# Patient Record
Sex: Male | Born: 1975 | Race: White | Hispanic: No | Marital: Single | Smoking: Current every day smoker
Health system: Southern US, Community
[De-identification: ages and names within clinical notes are randomized; demographics above are authoritative.]

## PROBLEM LIST (undated history)

## (undated) DIAGNOSIS — F1110 Opioid abuse, uncomplicated: Secondary | ICD-10-CM

## (undated) DIAGNOSIS — F1520 Other stimulant dependence, uncomplicated: Secondary | ICD-10-CM

## (undated) DIAGNOSIS — F32A Depression, unspecified: Secondary | ICD-10-CM

## (undated) DIAGNOSIS — F329 Major depressive disorder, single episode, unspecified: Secondary | ICD-10-CM

## (undated) DIAGNOSIS — F419 Anxiety disorder, unspecified: Secondary | ICD-10-CM

---

## 2005-02-13 ENCOUNTER — Emergency Department (HOSPITAL_COMMUNITY): Admission: EM | Admit: 2005-02-13 | Discharge: 2005-02-13 | Payer: Self-pay | Admitting: Emergency Medicine

## 2008-03-02 ENCOUNTER — Emergency Department (HOSPITAL_COMMUNITY): Admission: EM | Admit: 2008-03-02 | Discharge: 2008-03-02 | Payer: Self-pay | Admitting: Emergency Medicine

## 2008-03-07 ENCOUNTER — Emergency Department (HOSPITAL_COMMUNITY): Admission: EM | Admit: 2008-03-07 | Discharge: 2008-03-08 | Payer: Self-pay | Admitting: Emergency Medicine

## 2008-03-10 ENCOUNTER — Emergency Department (HOSPITAL_COMMUNITY): Admission: EM | Admit: 2008-03-10 | Discharge: 2008-03-11 | Payer: Self-pay | Admitting: Emergency Medicine

## 2008-03-11 ENCOUNTER — Ambulatory Visit: Payer: Self-pay | Admitting: *Deleted

## 2008-03-11 ENCOUNTER — Inpatient Hospital Stay (HOSPITAL_COMMUNITY): Admission: AD | Admit: 2008-03-11 | Discharge: 2008-03-13 | Payer: Self-pay | Admitting: Psychiatry

## 2009-02-27 ENCOUNTER — Emergency Department (HOSPITAL_COMMUNITY): Admission: EM | Admit: 2009-02-27 | Discharge: 2009-02-28 | Payer: Self-pay | Admitting: Emergency Medicine

## 2009-02-28 ENCOUNTER — Inpatient Hospital Stay (HOSPITAL_COMMUNITY): Admission: RE | Admit: 2009-02-28 | Discharge: 2009-03-01 | Payer: Self-pay | Admitting: Psychiatry

## 2009-03-02 ENCOUNTER — Ambulatory Visit: Payer: Self-pay | Admitting: Psychiatry

## 2009-12-11 IMAGING — CR DG CHEST 2V
2 series · 2 of 2 positions shown · non-contrast
Comparison: 02/13/2005

CLINICAL DATA: Chest pain

CHEST - 2 VIEW

[w chest pa]
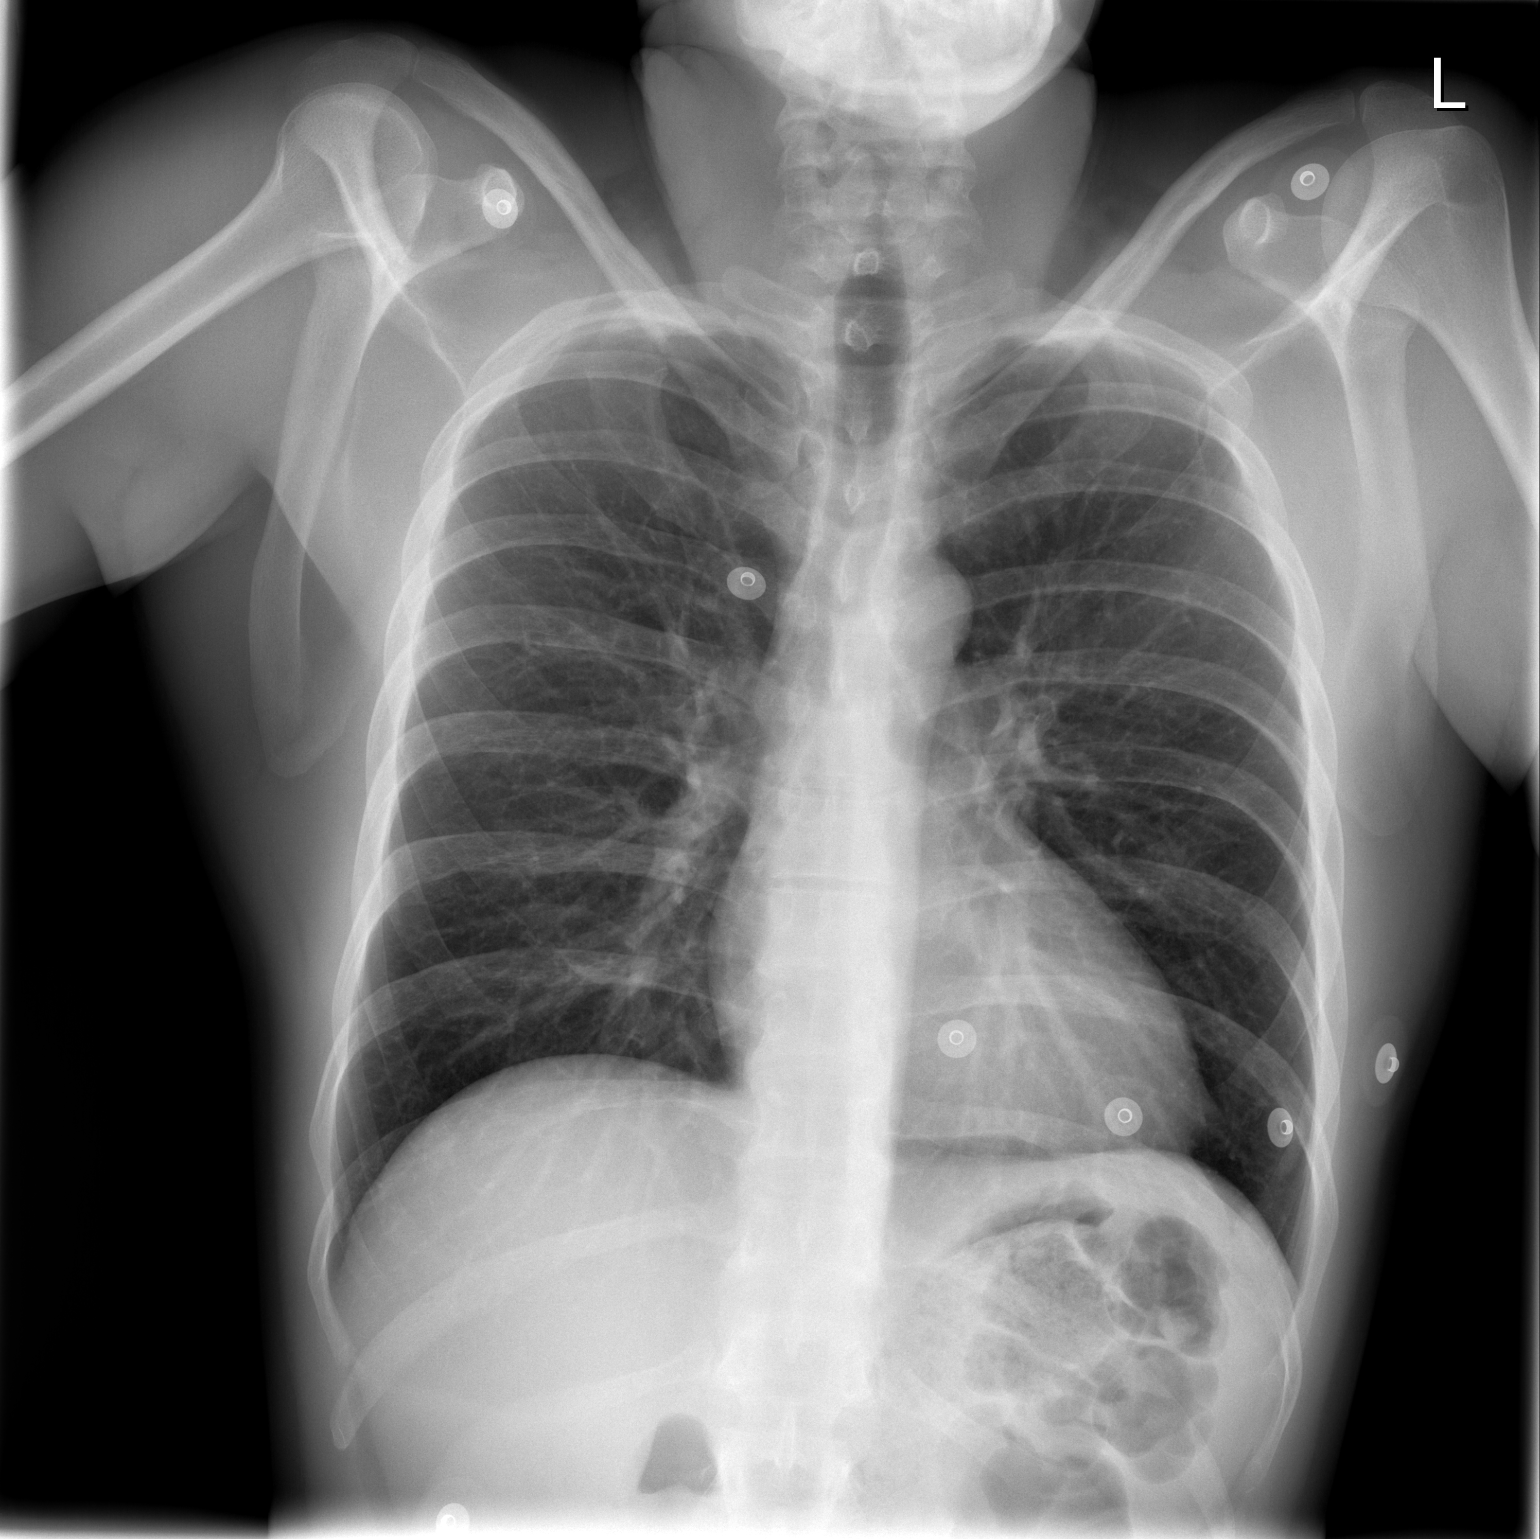

[w chest lat]
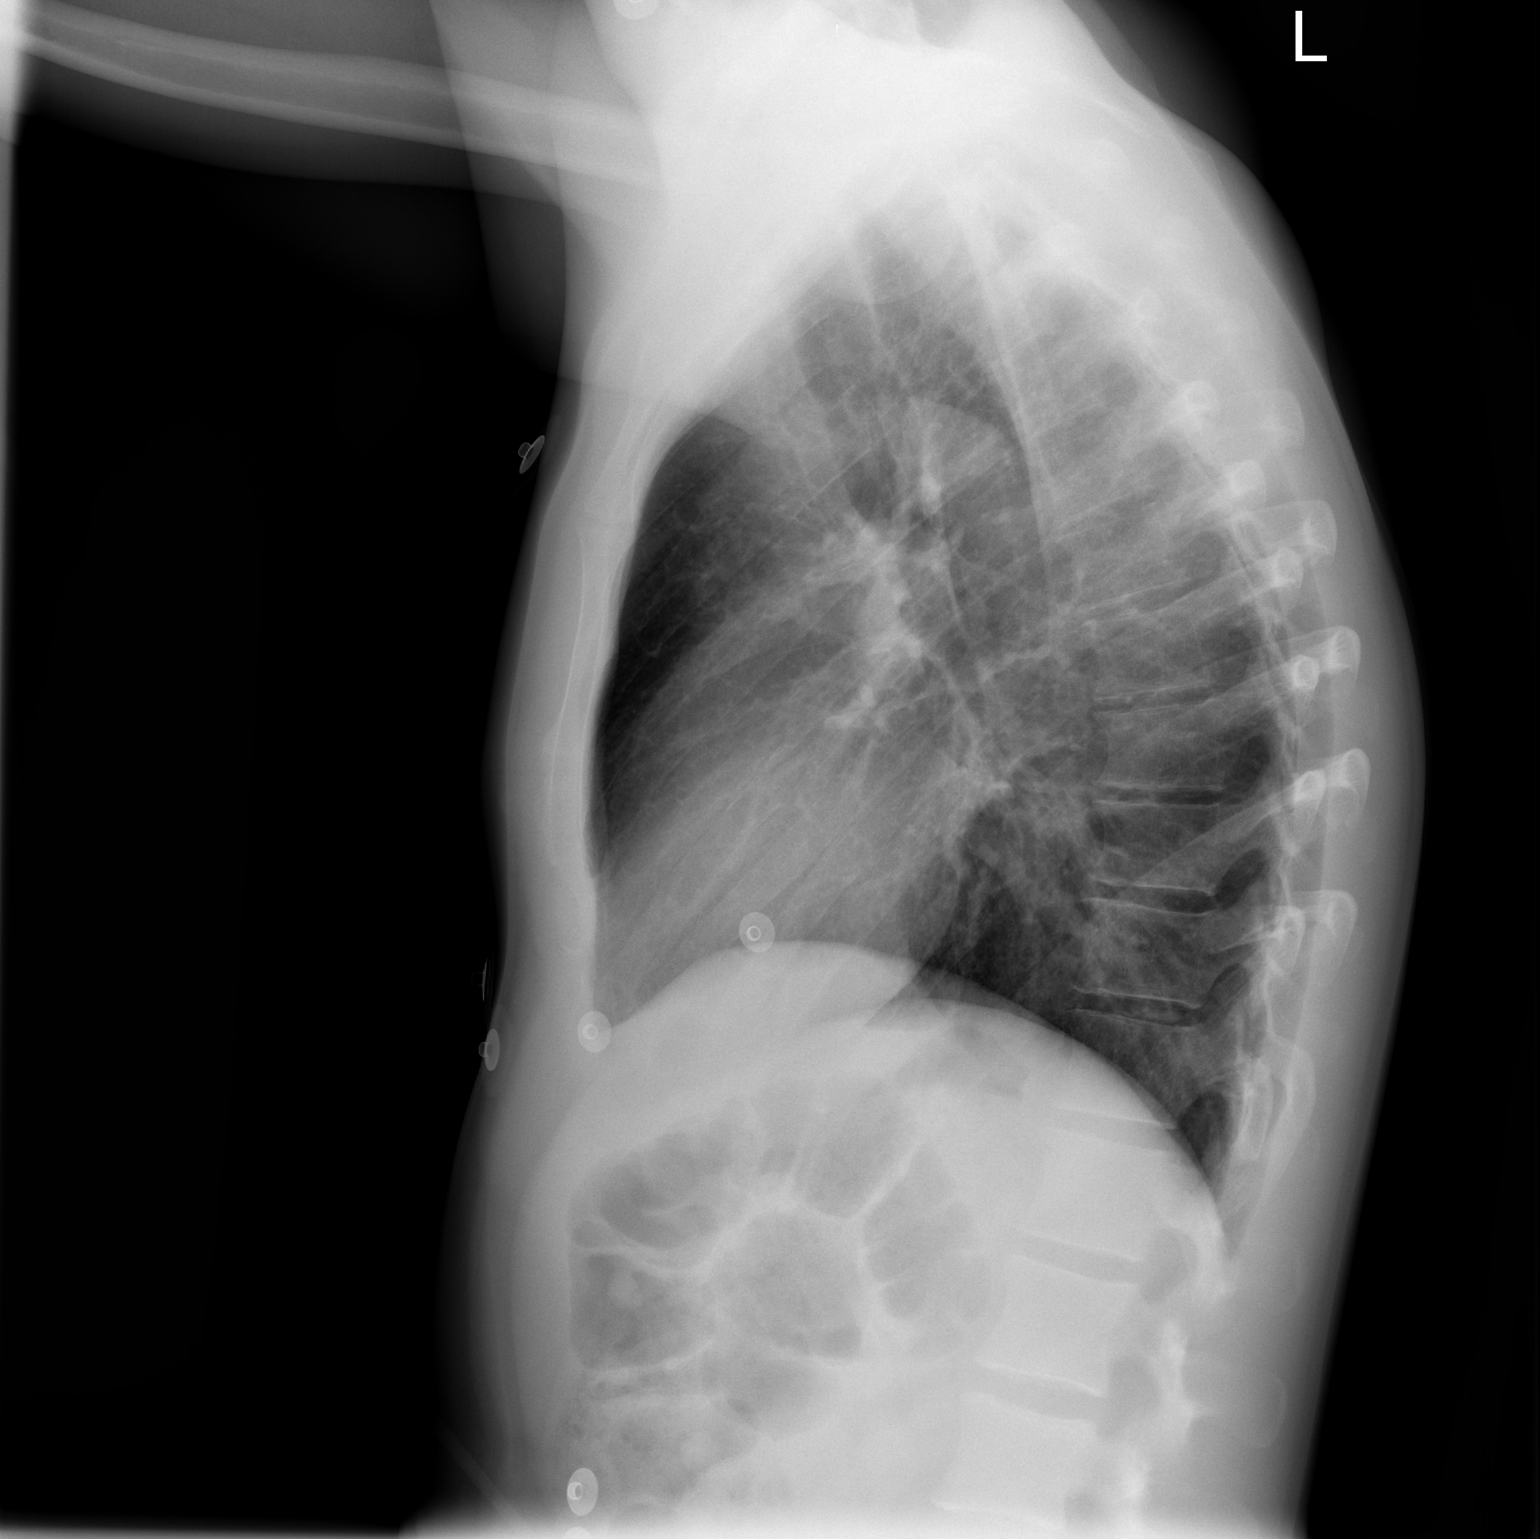

[2 of 2 positions shown; findings below may reference images not displayed]

FINDINGS: Normal heart size, mediastinal contours, and pulmonary vascularity.
Lungs clear.
Bones unremarkable.
No pneumothorax.
IMPRESSION: No acute abnormalities.

## 2010-09-11 LAB — COMPREHENSIVE METABOLIC PANEL
ALT: 14 U/L (ref 0–53)
Alkaline Phosphatase: 60 U/L (ref 39–117)
BUN: 7 mg/dL (ref 6–23)
CO2: 26 mEq/L (ref 19–32)
Calcium: 9 mg/dL (ref 8.4–10.5)
Chloride: 104 mEq/L (ref 96–112)
GFR calc Af Amer: >60 mL/min (ref >60)
GFR calc non Af Amer: >60 mL/min (ref >60)
Glucose, Bld: 108 mg/dL — ABNORMAL HIGH (ref 70–99)
Total Bilirubin: 0.5 mg/dL (ref 0.3–1.2)

## 2010-09-11 LAB — URINALYSIS, ROUTINE W REFLEX MICROSCOPIC
Bilirubin Urine: NEGATIVE
Urobilinogen, UA: 0.2 mg/dL (ref 0.0–1.0)
pH: 5.5 (ref 5.0–8.0)

## 2010-09-11 LAB — CBC
HCT: 45.4 % (ref 39.0–52.0)
Hemoglobin: 15.7 g/dL (ref 13.0–17.0)
MCHC: 34.6 g/dL (ref 30.0–36.0)
Platelets: 284 10*3/uL (ref 150–400)
RBC: 4.82 MIL/uL (ref 4.22–5.81)

## 2010-09-11 LAB — DIFFERENTIAL
Basophils Relative: 1 % (ref 0–1)
Eosinophils Relative: 2 % (ref 0–5)
Lymphs Abs: 3 10*3/uL (ref 0.7–4.0)
Monocytes Relative: 6 % (ref 3–12)

## 2010-09-11 LAB — RAPID URINE DRUG SCREEN, HOSP PERFORMED
Barbiturates: NOT DETECTED
Cocaine: NOT DETECTED
Opiates: NOT DETECTED
Tetrahydrocannabinol: NOT DETECTED

## 2010-09-11 LAB — ETHANOL: Alcohol, Ethyl (B): 275 mg/dL — ABNORMAL HIGH (ref 0–10)

## 2010-10-20 NOTE — H&P (Signed)
NAME:  Adam Riley, Adam Riley                   ACCOUNT NO.:  [id number]   MEDICAL RECORD NO.:  [id number]          PATIENT TYPE:  IPS   LOCATION:  0301                          FACILITY:  [facility]   PHYSICIAN:  Jasmine Pang, M.D. DATE OF BIRTH:  [birth year]   DATE OF ADMISSION:  03/11/2008  DATE OF DISCHARGE:                       PSYCHIATRIC ADMISSION ASSESSMENT   This is a 35 year old male voluntarily admitted on March 11, 2008.   HISTORY OF PRESENT ILLNESS:  The patient presents feeling depressed,  having anxiety attacks.  Has been seeing things and hearing things.  He states they are not everyday.  He states that when he does have  having any psychotic symptoms it makes him anxious.  He has been  drinking beer almost on a daily basis to help with the voices.  He has  lost about 25-30 pounds.  He wants to look for some long-term rehab.  He  does report some suicidal thoughts but has no specific plan.  He denies  any drug use and has had no prior mental health treatment at this time.   PAST PSYCHIATRIC HISTORY:  First admission to the [facility].  He was, at the age of 51, was in [facility] where he  states he has a history of cutting.  Denies any current self-inflicted  injuries and is currently sponsored by [facility].   SOCIAL HISTORY:  He is a 35 year old divorced male who lives in  [place].  He was living with a roommate.  The patient has three  children that he states he is not able to see.  He has a ninth grade  education and is unemployed.   FAMILY HISTORY:  None.   ALCOHOL AND DRUG HISTORY:  The patient smokes.  Denies any drug use.  Again, has been drinking up to a case of beer at a time.  Alcohol level  in the emergency room was 346.   PRIMARY CARE Markes Shatswell:  None.   MEDICAL PROBLEMS:  He reports a history of a heart murmur.  No other  cardiac problems.  Denies any other chronic health issues.   MEDICATIONS:  None prior to arrival.   DRUG  ALLERGIES:  No known allergies.   PHYSICAL EXAMINATION:  GENERAL:  This is a somewhat slender male.  He is  casually dressed.  He appears in no acute distress.  He does seem to be  internally preoccupied.  He was fully assessed at [facility] emergency  department where it states that the patient was paranoid, was defensive  and accusatory, talking about government involvement and being under  cover.  VITAL SIGNS:  Temperature of 97.1, 102 heart rate, 18 respirations,  blood pressure is 158/87, 5 feet 5 inches tall, 118 pounds.   LABORATORY DATA:  CMET within normal limits.  CBC within normal limits.  Urinalysis is negative.  Urine drug screen is negative.  Alcohol level  was 346, down to 241 prior to transfer.  Salicylate less than 4.  X-ray  was done of his left hand (he was reporting complaints of pain) which  was  negative.   MENTAL STATUS EXAM:  The patient is fully alert.  He is cooperative with  minimal eye contact.  Appears somewhat unkempt.  He seems to be slender  and does report that he has lost about 20-30 pounds over an unspecified  period of time.  Speech is soft-spoken.  Mood is depressed.  He seems to  be possibly responding to internal stimuli, but there are no obvious  delusional statements at this time.  Denies any current suicidal  thoughts.  He is wanting help in regards to his alcohol use.  He seems  to be aware of self and situation.  Judgment is fair.   IMPRESSION:  AXIS I:  Depressive disorder NOS.  Alcohol abuse, rule out  dependence, rule out substance-induced psychosis.  AXIS II:  Deferred.  AXIS III:  Heart murmur per patient.  AXIS IV:  Possible problems with housing, occupation, psychosocial  problems, lack of support.  AXIS V:  Current is 30-35.   PLAN:  Plans to contract for safety.  Stabilize mood and will initiate  the Librium protocol.  Will monitor withdrawal symptoms.  Work on  relapse prevention.  We will have Zyprexa available at bedtime for   psychotic symptoms and have p.r.n. Zyprexa throughout the day for  psychosis and agitation.  We will identify support.  Case manager will  look at any long-term rehabilitation, reinforce medication compliance  and continue to assess comorbidities.   TENTATIVE LENGTH OF STAY:  The tentative length of stay at this time is  4-5 days.      Landry Corporal, N.P.      Jasmine Pang, M.D.  Electronically Signed    JO/MEDQ  D:  03/12/2008  T:  03/12/2008  Job:  [id number]

## 2010-10-23 NOTE — Discharge Summary (Signed)
NAME:  Adam Riley, Adam Riley                   ACCOUNT NO.:  [id number]   MEDICAL RECORD NO.:  [id number]          PATIENT TYPE:  IPS   LOCATION:  0301                          FACILITY:  [facility]   PHYSICIAN:  Jasmine Pang, M.D. DATE OF BIRTH:  Dec 21, 1975   DATE OF ADMISSION:  03/11/2008  DATE OF DISCHARGE:  03/13/2008                               DISCHARGE SUMMARY   IDENTIFICATION:  This is a 35 year old male who was admitted on a  voluntary basis on March 11, 2008.   HISTORY OF PRESENT ILLNESS:  The patient presents feeling depressed.  He  is also having anxiety attacks.  He states he has been seeing things  and hearing things.  He states they are not every day.  He states that  when he does have any psychotic symptoms, makes him anxious.  He has  been drinking beer almost on a daily basis to help with the voices and  he has lost about 25-30 pounds.  He wants to look for some long-term  rehab.  He does report some suicidal thoughts, but has no specific plan.  He denies any drug use and has had no prior mental health treatment in  this hospital.   PAST PSYCHIATRIC HISTORY:  This is the first admission to [facility].  He was  at the age of 68 in [facility] and he states he has history of  cutting.  He denies any current self-inflicted injuries.  He is  currently sponsored by [facility].   FAMILY HISTORY:  None.   ALCOHOL AND DRUG HISTORY:  The patient smokes.  He denies any drug use.  Again, he has been drinking up to a case of beer at a time.  Alcohol  level in the emergency room was 346.   PRIMARY CARE Raevon Broom:  None.   MEDICAL PROBLEMS:  He reports a history of a heart murmur.  No other  cardiac problems.  He denies any other chronic health issues.   MEDICATIONS:  None.   DRUG ALLERGIES:  No known drug allergies.   PHYSICAL FINDINGS:  There were no acute physical or medical problems  noted.  He was fully assessed at the [facility] Emergency Department and  stated the  patient was paranoid, defensive, and accusatory and talking  about governmental involvement and being undercover.   LABORATORY DATA:  CMET was within normal limits.  CBC was within normal  limits.  Urinalysis is negative.  Urine drug screen is negative.  Alcohol level was 346, down to 241 prior to transfer.  Salicylate level  less than 4.  X-ray was done of the left hand as he was reporting pain.  This was negative.   HOSPITAL COURSE:  Upon admission, the patient was started on the Librium  detox protocol and Ambien 10 mg p.o. q.h.s. p.r.n.  He was also started  on Zyprexa Zydis 5 mg q.6 hours p.r.n. agitation, psychosis and Zyprexa  Zydis 5 mg p.o. q.h.s.  In individual sessions, the patient was friendly  and cooperative.  He states he was having anxiety attacks and seeing  things and hearing things.  He states he went to the ED after calling  911.  He was depressed.  He talked about housing.  Depression and stress  being his main focus of treatment.  He did not have a place to live.  He  does not think his grandmother or father would taking him again.  He was  offered possibly substance abuse treatment or [employer].  He was more  interested in the [facility] and was given a list of these.  On  March 13, 2008, the patient admitted.  He had lied about suicidal  ideation or to get into the hospital because he had no place to go.  His  sleep was good.  Appetite was good.  Mood euthymic.  Affect consistent  with mood.  There was no suicidal or homicidal ideation.  No thoughts of  self-injurious behavior.  No auditory or visual hallucinations.  No  paranoia or delusions.  Thoughts were logical and goal-directed, thought  content.  No predominant theme.  Cognitive was grossly intact.  Insight  good, judgment good, impulse control good.  The patient was felt to be  safe for discharge.   DISCHARGE DIAGNOSES:  Axis I:  Mood disorder, not otherwise specified.  Alcohol dependence.  Axis  II:  None.  Axis III:  Heart murmur per the patient.  Axis IV:  Severe (possible problems with housing, occupation and  psychosocial problems, lack of support, and burden of psychiatric and  chemical dependence illness).  Axis V:  Global assessment of functioning was 50 upon discharge.  GAF  was 35 upon admission.  GAF was 60-65 highest past year.   DISCHARGE PLANS:  There was no specific activity level or dietary  restrictions.   POSTHOSPITAL CARE PLANS:  The patient will go to the [facility] in  [place] on March 15, 2008, to 9 o'clock a.m.   DISCHARGE MEDICATIONS:  1. Zyprexa 5 mg at bedtime.  He was given a prescription for these as      well as some samples.  2. Librium 25 mg 1 pill today, 1 pill twice daily tomorrow, then 1      pill daily on March 15, 2008, then discontinue and detox will be      completed.      Jasmine Pang, M.D.  Electronically Signed     BHS/MEDQ  D:  04/13/2008  T:  04/14/2008  Job:  [id number]

## 2010-11-03 ENCOUNTER — Emergency Department (HOSPITAL_COMMUNITY)
Admission: EM | Admit: 2010-11-03 | Discharge: 2010-11-04 | Disposition: A | Discharge status: Other Acute Inpatient Hospital | Payer: Self-pay | Attending: Emergency Medicine | Admitting: Emergency Medicine

## 2010-11-03 DIAGNOSIS — R45851 Suicidal ideations: Secondary | ICD-10-CM | POA: Insufficient documentation

## 2010-11-03 DIAGNOSIS — F1010 Alcohol abuse, uncomplicated: Secondary | ICD-10-CM | POA: Insufficient documentation

## 2010-11-03 DIAGNOSIS — F411 Generalized anxiety disorder: Secondary | ICD-10-CM | POA: Insufficient documentation

## 2010-11-03 DIAGNOSIS — F4310 Post-traumatic stress disorder, unspecified: Secondary | ICD-10-CM | POA: Insufficient documentation

## 2010-11-03 DIAGNOSIS — IMO0002 Reserved for concepts with insufficient information to code with codable children: Secondary | ICD-10-CM | POA: Insufficient documentation

## 2010-11-03 DIAGNOSIS — F29 Unspecified psychosis not due to a substance or known physiological condition: Secondary | ICD-10-CM | POA: Insufficient documentation

## 2010-11-03 LAB — COMPREHENSIVE METABOLIC PANEL
ALT: 143 U/L — ABNORMAL HIGH (ref 0–53)
AST: 61 U/L — ABNORMAL HIGH (ref 0–37)
Albumin: 4.2 g/dL (ref 3.5–5.2)
Alkaline Phosphatase: 67 U/L (ref 39–117)
BUN: 9 mg/dL (ref 6–23)
CO2: 22 mEq/L (ref 19–32)
Calcium: 8.8 mg/dL (ref 8.4–10.5)
Chloride: 96 mEq/L (ref 96–112)
GFR calc Af Amer: >60 mL/min (ref >60)
GFR calc non Af Amer: >60 mL/min (ref >60)
Glucose, Bld: 82 mg/dL (ref 70–99)
Potassium: 3.7 mEq/L (ref 3.5–5.1)
Total Protein: 8.3 g/dL (ref 6.0–8.3)

## 2010-11-03 LAB — ETHANOL: Alcohol, Ethyl (B): 258 mg/dL — ABNORMAL HIGH (ref 0–10)

## 2010-11-03 LAB — RAPID URINE DRUG SCREEN, HOSP PERFORMED
Amphetamines: NOT DETECTED
Benzodiazepines: NOT DETECTED
Cocaine: NOT DETECTED

## 2010-11-03 LAB — CBC
HCT: 44.1 % (ref 39.0–52.0)
MCHC: 35.6 g/dL (ref 30.0–36.0)
Platelets: 312 10*3/uL (ref 150–400)
RDW: 12.6 % (ref 11.5–15.5)
WBC: 8.9 10*3/uL (ref 4.0–10.5)

## 2010-11-03 LAB — DIFFERENTIAL
Basophils Absolute: 0 10*3/uL (ref 0.0–0.1)
Lymphocytes Relative: 29 % (ref 12–46)

## 2010-11-04 ENCOUNTER — Inpatient Hospital Stay (HOSPITAL_COMMUNITY)
Admission: AD | Admit: 2010-11-04 | Discharge: 2010-11-06 | DRG: 885 | Disposition: A | Discharge status: Home / Self Care | Payer: PRIVATE HEALTH INSURANCE | Source: Ambulatory Visit | Attending: Psychiatry | Admitting: Psychiatry

## 2010-11-04 DIAGNOSIS — F3289 Other specified depressive episodes: Secondary | ICD-10-CM

## 2010-11-04 DIAGNOSIS — Z5900 Homelessness unspecified: Secondary | ICD-10-CM

## 2010-11-04 DIAGNOSIS — F4320 Adjustment disorder, unspecified: Secondary | ICD-10-CM

## 2010-11-04 DIAGNOSIS — Z560 Unemployment, unspecified: Secondary | ICD-10-CM

## 2010-11-04 DIAGNOSIS — F29 Unspecified psychosis not due to a substance or known physiological condition: Principal | ICD-10-CM

## 2010-11-04 DIAGNOSIS — R45851 Suicidal ideations: Secondary | ICD-10-CM

## 2010-11-04 DIAGNOSIS — Z6379 Other stressful life events affecting family and household: Secondary | ICD-10-CM

## 2010-11-04 DIAGNOSIS — F1010 Alcohol abuse, uncomplicated: Secondary | ICD-10-CM

## 2010-11-04 DIAGNOSIS — F411 Generalized anxiety disorder: Secondary | ICD-10-CM

## 2010-11-04 DIAGNOSIS — F329 Major depressive disorder, single episode, unspecified: Secondary | ICD-10-CM

## 2010-11-05 DIAGNOSIS — F1020 Alcohol dependence, uncomplicated: Secondary | ICD-10-CM

## 2010-11-06 DIAGNOSIS — F1020 Alcohol dependence, uncomplicated: Secondary | ICD-10-CM

## 2010-11-06 DIAGNOSIS — F39 Unspecified mood [affective] disorder: Secondary | ICD-10-CM
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
## 2011-03-08 LAB — URINALYSIS, ROUTINE W REFLEX MICROSCOPIC
Glucose, UA: NEGATIVE
Nitrite: NEGATIVE
Protein, ur: NEGATIVE
Urobilinogen, UA: 0.2

## 2011-03-08 LAB — RAPID URINE DRUG SCREEN, HOSP PERFORMED
Barbiturates: NOT DETECTED
Benzodiazepines: NOT DETECTED
Cocaine: NOT DETECTED
Opiates: NOT DETECTED
Tetrahydrocannabinol: NOT DETECTED

## 2011-03-08 LAB — DIFFERENTIAL
Basophils Absolute: 0.1
Eosinophils Absolute: 0.2
Eosinophils Relative: 1
Eosinophils Relative: 2
Lymphocytes Relative: 34
Lymphocytes Relative: 42
Lymphs Abs: 3.2
Monocytes Relative: 7

## 2011-03-08 LAB — COMPREHENSIVE METABOLIC PANEL
ALT: 19
ALT: 19
AST: 28
BUN: 7
Calcium: 9
Creatinine, Ser: 0.75
Creatinine, Ser: 0.63
GFR calc Af Amer: >60
Glucose, Bld: 93
Potassium: 3.8
Sodium: 142
Total Bilirubin: 0.7
Total Protein: 7.2

## 2011-03-08 LAB — ETHANOL
Alcohol, Ethyl (B): 332 — ABNORMAL HIGH
Alcohol, Ethyl (B): 93 — ABNORMAL HIGH
Alcohol, Ethyl (B): 241 — ABNORMAL HIGH

## 2011-03-08 LAB — POCT CARDIAC MARKERS: CKMB, poc: 2.7

## 2011-03-08 LAB — CBC
HCT: 46
MCHC: 34.5
MCHC: 34.5
Platelets: 273
Platelets: 280
RBC: 4.46
RDW: 12.6
WBC: 7.4

## 2011-03-08 LAB — ACETAMINOPHEN LEVEL: Acetaminophen (Tylenol), Serum: <10 — ABNORMAL LOW

## 2011-03-09 LAB — DIFFERENTIAL
Basophils Absolute: 0
Basophils Relative: 0
Eosinophils Absolute: 0
Eosinophils Relative: 1
Lymphocytes Relative: 38
Lymphs Abs: 2.9
Monocytes Absolute: 0.4
Monocytes Relative: 5
Neutro Abs: 4.3
Neutrophils Relative %: 56

## 2011-03-09 LAB — RAPID URINE DRUG SCREEN, HOSP PERFORMED
Amphetamines: NOT DETECTED
Barbiturates: NOT DETECTED
Benzodiazepines: NOT DETECTED
Cocaine: NOT DETECTED
Opiates: NOT DETECTED
Tetrahydrocannabinol: NOT DETECTED

## 2011-03-09 LAB — CBC
HCT: 46.3
Hemoglobin: 15.7
MCHC: 34
MCV: 97.3
Platelets: 288
RBC: 4.76
RDW: 12.5
WBC: 7.7

## 2011-03-09 LAB — COMPREHENSIVE METABOLIC PANEL
ALT: 20
AST: 25
Albumin: 4.3
Alkaline Phosphatase: 53
BUN: 6
CO2: 26
Calcium: 9.1
Chloride: 103
Creatinine, Ser: 0.68
GFR calc Af Amer: >60
GFR calc non Af Amer: >60
Glucose, Bld: 105 — ABNORMAL HIGH
Potassium: 4.2
Sodium: 140
Total Bilirubin: 0.8
Total Protein: 7.5

## 2011-03-09 LAB — ETHANOL: Alcohol, Ethyl (B): 296 — ABNORMAL HIGH

## 2011-04-11 ENCOUNTER — Encounter: Payer: Self-pay | Admitting: *Deleted

## 2011-04-11 ENCOUNTER — Emergency Department (HOSPITAL_COMMUNITY)
Admission: EM | Admit: 2011-04-11 | Discharge: 2011-04-12 | Disposition: A | Discharge status: Home / Self Care | Payer: Self-pay | Attending: Emergency Medicine | Admitting: Emergency Medicine

## 2011-04-11 DIAGNOSIS — IMO0002 Reserved for concepts with insufficient information to code with codable children: Secondary | ICD-10-CM | POA: Insufficient documentation

## 2011-04-11 DIAGNOSIS — F329 Major depressive disorder, single episode, unspecified: Secondary | ICD-10-CM | POA: Insufficient documentation

## 2011-04-11 DIAGNOSIS — F1010 Alcohol abuse, uncomplicated: Secondary | ICD-10-CM | POA: Insufficient documentation

## 2011-04-11 DIAGNOSIS — R45851 Suicidal ideations: Secondary | ICD-10-CM | POA: Insufficient documentation

## 2011-04-11 DIAGNOSIS — F3289 Other specified depressive episodes: Secondary | ICD-10-CM | POA: Insufficient documentation

## 2011-04-11 HISTORY — DX: Anxiety disorder, unspecified: F41.9
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
## 2011-04-11 NOTE — ED Provider Notes (Signed)
History     CSN: [id number] Arrival date & time: 04/11/2011 11:22 PM   First MD Initiated Contact with Patient 04/11/11 2338      Chief Complaint  Patient presents with  . Psychiatric Evaluation    (Consider location/radiation/quality/duration/timing/severity/associated sxs/prior treatment) HPI Comments: Pt with hx of alcohol intoxication and "PTSD" according to pt - has been seen at [facility] several times in the past for ETOH detox and found to not have [employer] service which he says he has when he sobers up - pt states only "I want to die" and states he has nothing to live for.  He admits to attempting suicide tonight but does not say how.  He has no physical c/o but refuses to answer most questions.  Hx limited by alcohol intoxication and uncooperativeness  The history is provided by the patient and medical records. The history is limited by the condition of the patient.    Past Medical History  Diagnosis Date  . Anxiety     History reviewed. No pertinent past surgical history.  History reviewed. No pertinent family history.  History  Substance Use Topics  . Smoking status: Current Everyday Smoker  . Smokeless tobacco: Not on file  . Alcohol Use: Yes      Review of Systems  Unable to perform ROS: Other    Allergies  Review of patient's allergies indicates no known allergies.  Home Medications  No current outpatient prescriptions on file.  BP 121/61  Pulse 90  Temp(Src) 97.7 F (36.5 C) (Oral)  Resp 20  SpO2 96%  Physical Exam  Nursing note and vitals reviewed. Constitutional: He appears well-developed and well-nourished.       agitated  HENT:  Head: Normocephalic and atraumatic.  Mouth/Throat: Oropharynx is clear and moist. No oropharyngeal exudate.  Eyes: Conjunctivae and EOM are normal. Pupils are equal, round, and reactive to light. Right eye exhibits no discharge. Left eye exhibits no discharge. No scleral icterus.  Neck: Normal range of motion. Neck  supple. No JVD present. No thyromegaly present.  Cardiovascular: Normal rate, regular rhythm, normal heart sounds and intact distal pulses.  Exam reveals no gallop and no friction rub.   No murmur heard. Pulmonary/Chest: Effort normal and breath sounds normal. No respiratory distress. He has no wheezes. He has no rales.  Abdominal: Soft. Bowel sounds are normal. He exhibits no distension and no mass. There is no tenderness.  Musculoskeletal: Normal range of motion. He exhibits no edema and no tenderness.  Lymphadenopathy:    He has no cervical adenopathy.  Neurological: He is alert.       Slight dyscoordination withi ambulation, slurred speech slightly, follows commands, normal strnegth, no facial droop  Skin: Skin is warm and dry. No rash noted. No erythema.  Psychiatric:       Mood depressed, admits to SI and attempt    ED Course  Procedures (including critical care time)  Labs Reviewed  COMPREHENSIVE METABOLIC PANEL - Abnormal; Notable for the following:    AST 78 (*)    ALT 88 (*)    Total Bilirubin 0.1 (*)    All other components within normal limits  ETHANOL - Abnormal; Notable for the following:    Alcohol, Ethyl (B) 237 (*)    All other components within normal limits  CBC  URINE RAPID DRUG SCREEN (HOSP PERFORMED)   No results found.   1. Suicidal ideation   2. Alcohol abuse       MDM  Will need  w/u for SI and ETOH abuse - has normal VS at this time, no signs of self injury.  Has hx of ETOH abuse with delusions of miilitary service.   Labs reviewed, slight elevation of LFT"s, likely ETOH related, VS stable, psychiatry ACT team aware, change of shift - oncoming EDP to follow.     Vida Roller, MD 04/12/11 [phone]

## 2011-04-12 DIAGNOSIS — Z139 Encounter for screening, unspecified: Secondary | ICD-10-CM | POA: Insufficient documentation

## 2011-04-12 DIAGNOSIS — F411 Generalized anxiety disorder: Secondary | ICD-10-CM | POA: Insufficient documentation

## 2011-04-12 DIAGNOSIS — F1010 Alcohol abuse, uncomplicated: Secondary | ICD-10-CM | POA: Insufficient documentation

## 2011-04-12 DIAGNOSIS — F17200 Nicotine dependence, unspecified, uncomplicated: Secondary | ICD-10-CM | POA: Insufficient documentation

## 2011-04-12 LAB — COMPREHENSIVE METABOLIC PANEL
AST: 78 U/L — ABNORMAL HIGH (ref 0–37)
BUN: 10 mg/dL (ref 6–23)
CO2: 25 mEq/L (ref 19–32)
Calcium: 9.2 mg/dL (ref 8.4–10.5)
Chloride: 104 mEq/L (ref 96–112)
Creatinine, Ser: 0.65 mg/dL (ref 0.50–1.35)
GFR calc Af Amer: >90 mL/min (ref >90)
GFR calc non Af Amer: >90 mL/min (ref >90)
Glucose, Bld: 93 mg/dL (ref 70–99)
Total Bilirubin: 0.1 mg/dL — ABNORMAL LOW (ref 0.3–1.2)

## 2011-04-12 LAB — CBC
HCT: 43.3 % (ref 39.0–52.0)
Hemoglobin: 14.9 g/dL (ref 13.0–17.0)
MCH: 32.8 pg (ref 26.0–34.0)
MCV: 95.4 fL (ref 78.0–100.0)
Platelets: 315 10*3/uL (ref 150–400)
RBC: 4.54 MIL/uL (ref 4.22–5.81)
WBC: 8 10*3/uL (ref 4.0–10.5)

## 2011-04-12 LAB — RAPID URINE DRUG SCREEN, HOSP PERFORMED
Barbiturates: NOT DETECTED
Cocaine: NOT DETECTED
Opiates: NOT DETECTED
Tetrahydrocannabinol: NOT DETECTED

## 2011-04-12 NOTE — ED Notes (Signed)
Sitter at bedside,  Pt in NAD

## 2011-04-12 NOTE — Progress Notes (Addendum)
Assessment Note   Adam Riley is an 35 y.o. male that presented to the ED inebriated and voicing suicidal ideation.  Pt has an extensive alcohol history and has two previous admissions for detox in 2009 and 2011.  Pt reports ongoing drinking, but is not currently interested in detox or substance abuse treatment due to job prospects.  Pt is able to contract for safety at this time and denies SI, or HI, or any active psychosis or withdrawal symptoms.  Information relayed to EDP and nursing staff who are agreeable with discharging patient with follow up referrals.   Axis I: Alcohol Abuse Axis II: Deferred Axis III: None Past Medical History  Diagnosis Date  . Anxiety    Axis IV: other psychosocial or environmental problems, problems related to social environment and problems with primary support group Axis V: 41-50 serious symptoms  Past Medical History:  Past Medical History  Diagnosis Date  . Anxiety     History reviewed. No pertinent past surgical history.  Family History: History reviewed. No pertinent family history.  Social History:  reports that he has been smoking.  He does not have any smokeless tobacco history on file. He reports that he drinks alcohol. He reports that he does not use illicit drugs.  Allergies: No Known Allergies  Home Medications:  No current outpatient prescriptions on file as of 04/11/2011.   No current facility-administered medications on file as of 04/11/2011.    OB/GYN Status:  No LMP for male patient.  General Assessment Data Living Arrangements: Homeless Can pt return to current living arrangement?: Yes Admission Status: Voluntary Is patient capable of signing voluntary admission?: Yes Transfer from: Acute Hospital Referral Source: MD  Risk to self Suicidal Ideation: No-Not Currently/Within Last 6 Months Suicidal Intent: No-Not Currently/Within Last 6 Months Is patient at risk for suicide?: Yes Suicidal Plan?: No-Not Currently/Within Last  6 Months Access to Means: Yes Specify Access to Suicidal Means: knives and pills available What has been your use of drugs/alcohol within the last 12 months?: Pt drinks up to 9 12 ozs QD for years Other Self Harm Risks: none Triggers for Past Attempts: Other personal contacts Intentional Self Injurious Behavior: None Factors that decrease suicide risk: Absense of psychosis Family Suicide History: No Recent stressful life event(s): Job Loss;Financial Problems Persecutory voices/beliefs?: No Depression: Yes Depression Symptoms: Feeling worthless/self pity;Feeling angry/irritable Substance abuse history and/or treatment for substance abuse?: Yes Suicide prevention information given to non-admitted patients: Yes  Risk to Others Homicidal Ideation: No Thoughts of Harm to Others: No Current Homicidal Intent: No Current Homicidal Plan: No Access to Homicidal Means: No Identified Victim: n/a History of harm to others?: No Assessment of Violence: None Noted Does patient have access to weapons?: No Criminal Charges Pending?: No Does patient have a court date: No  Mental Status Report Appear/Hygiene: Disheveled Eye Contact: Good Motor Activity: Unremarkable Speech: Logical/coherent Level of Consciousness: Alert Mood: Depressed;Ambivalent Affect: Preoccupied;Anxious Anxiety Level: Moderate Thought Processes: Relevant Judgement: Unimpaired Orientation: Person;Place;Time;Situation Obsessive Compulsive Thoughts/Behaviors: Moderate  Cognitive Functioning Concentration: Decreased Memory: Recent Impaired;Remote Intact IQ: Average Insight: Fair Impulse Control: Poor Appetite: Fair Sleep: No Change Vegetative Symptoms: None  Prior Inpatient/Outpatient Therapy Prior Therapy: Inpatient Prior Therapy Dates: 2009. 2011 Prior Therapy Facilty/Provider(s): [facility] Reason for Treatment: SA/Detox            Values / Beliefs Cultural Requests During Hospitalization: None Spiritual  Requests During Hospitalization: None        Additional Information 1:1 In Past 12 Months?:  No CIRT Risk: No Elopement Risk: No Does patient have medical clearance?: Yes     Disposition:  Disposition Disposition of Patient: Referred to [facility] and [facility]) Patient referred to: [facility]- patient refused inpatient treatment at this time and is able to contract for safety with referrals offered.  On Site Evaluation by:   Reviewed with Physician:     Angelica Ran 04/12/2011 4:31 PM

## 2011-04-12 NOTE — ED Notes (Signed)
Patient is resting comfortably. 

## 2011-04-12 NOTE — ED Notes (Signed)
Report given to Clovis Cao at [facility] ED.

## 2011-04-12 NOTE — ED Provider Notes (Signed)
Pt no longer suicidal and will follow up for out pt tx  Adam Lennert, MD 04/12/11 1135

## 2011-04-12 NOTE — ED Notes (Signed)
Pt woke up. This RN introduced to patient pt sts he is comfortable. Denied any needs. sts he is not Suicidal, sts he was SI last night because he was drunk.

## 2011-04-12 NOTE — ED Notes (Signed)
Report received from night RN. First contact with patient. Pt is asleep in room. Airway even and unlabor, nad noted, will continue to monitor. Sitter not available at this moment. Will transport and give report to get patient over th psy ed.

## 2011-04-12 NOTE — ED Notes (Signed)
Meal Tray Delivered.  

## 2011-04-12 NOTE — ED Notes (Signed)
Pt wanded by security in his clothing, pt did not want to remove his clothes. Dr. Hyacinth Meeker ok'd this if he was checked for cell phone and add'l objects. Merton Border, NT at bedside after security left, pt may be agreeable to putting on blue scrubs at this time. Explained to pt that he could not leave due to saying he wanted to die and that he had tried to kill himself. Pt continues to be cooperative at this time. Urine at bedside if needed. Pt ate [place] sandwich tray and fruit cup with a sprite ok'd by Dr. Hyacinth Meeker. Pt has a tshirt, pants, tennis shoes, underwear and blue wallet with him.

## 2011-04-13 ENCOUNTER — Encounter (HOSPITAL_COMMUNITY): Payer: Self-pay | Admitting: *Deleted

## 2011-04-13 ENCOUNTER — Emergency Department (HOSPITAL_COMMUNITY)
Admission: EM | Admit: 2011-04-13 | Discharge: 2011-04-13 | Disposition: A | Discharge status: Rehabilitation Facility | Payer: Self-pay | Attending: Emergency Medicine | Admitting: Emergency Medicine

## 2011-04-13 DIAGNOSIS — F1010 Alcohol abuse, uncomplicated: Secondary | ICD-10-CM

## 2011-04-13 HISTORY — DX: Depression, unspecified: F32.A

## 2011-04-13 HISTORY — DX: Major depressive disorder, single episode, unspecified: F32.9

## 2011-04-13 LAB — CBC
HCT: 44.6 % (ref 39.0–52.0)
Hemoglobin: 16.2 g/dL (ref 13.0–17.0)
MCH: 34.2 pg — ABNORMAL HIGH (ref 26.0–34.0)
RBC: 4.73 MIL/uL (ref 4.22–5.81)

## 2011-04-13 LAB — COMPREHENSIVE METABOLIC PANEL
ALT: 108 U/L — ABNORMAL HIGH (ref 0–53)
Alkaline Phosphatase: 64 U/L (ref 39–117)
BUN: 13 mg/dL (ref 6–23)
CO2: 24 mEq/L (ref 19–32)
GFR calc Af Amer: >90 mL/min (ref >90)
GFR calc non Af Amer: >90 mL/min (ref >90)
Glucose, Bld: 89 mg/dL (ref 70–99)
Potassium: 3.7 mEq/L (ref 3.5–5.1)
Sodium: 140 mEq/L (ref 135–145)

## 2011-04-13 LAB — ETHANOL
Alcohol, Ethyl (B): 314 mg/dL — ABNORMAL HIGH (ref 0–11)
Alcohol, Ethyl (B): 149 mg/dL — ABNORMAL HIGH (ref 0–11)

## 2011-04-13 MED ORDER — LORAZEPAM 1 MG PO TABS: 1.0000 mg | ORAL_TABLET | Freq: Four times a day (QID) | ORAL | Status: DC | PRN

## 2011-04-13 MED ORDER — FOLIC ACID 1 MG PO TABS: 1.0000 mg | ORAL_TABLET | Freq: Every day | ORAL | Status: DC

## 2011-04-13 MED ORDER — THIAMINE HCL 100 MG/ML IJ SOLN: 100.0000 mg | Freq: Every day | INTRAMUSCULAR | Status: DC

## 2011-04-13 MED ORDER — NICOTINE 21 MG/24HR TD PT24: 21.0000 mg | MEDICATED_PATCH | Freq: Every day | TRANSDERMAL | Status: DC

## 2011-04-13 MED ORDER — IBUPROFEN 200 MG PO TABS: 600.0000 mg | ORAL_TABLET | Freq: Three times a day (TID) | ORAL | Status: DC | PRN

## 2011-04-13 MED ORDER — ALUM & MAG HYDROXIDE-SIMETH 200-200-20 MG/5ML PO SUSP: 30.0000 mL | ORAL | Status: DC | PRN

## 2011-04-13 MED ORDER — ONDANSETRON HCL 4 MG PO TABS: 4.0000 mg | ORAL_TABLET | Freq: Three times a day (TID) | ORAL | Status: DC | PRN

## 2011-04-13 MED ORDER — VITAMIN B-1 100 MG PO TABS: 100.0000 mg | ORAL_TABLET | Freq: Every day | ORAL | Status: DC

## 2011-04-13 MED ORDER — LORAZEPAM 2 MG/ML IJ SOLN: 1.0000 mg | Freq: Four times a day (QID) | INTRAMUSCULAR | Status: DC | PRN

## 2011-04-13 MED ORDER — THERA M PLUS PO TABS: 1.0000 | ORAL_TABLET | Freq: Every day | ORAL | Status: DC

## 2011-04-13 MED ORDER — ZOLPIDEM TARTRATE 5 MG PO TABS: 5.0000 mg | ORAL_TABLET | Freq: Every evening | ORAL | Status: DC | PRN

## 2011-04-13 NOTE — ED Provider Notes (Signed)
History     CSN: [id number] Arrival date & time: 04/13/2011 12:02 AM   First MD Initiated Contact with Patient 04/13/11 0133      Chief Complaint  Patient presents with  . Medical Clearance    (Consider location/radiation/quality/duration/timing/severity/associated sxs/prior treatment) The history is provided by the patient.   patient here from [facility] for alcohol abuse. Patient stated tonight and his level is above 200. Patient seen here for evaluation placement. Patient denies suicidal or homicidal ideations. Patient seen here last night for alcohol abuse and suicidal ideations, however he was cleared from his suicidal ideations and discharged yesterday. Patient returns requesting help with his alcohol abuse. He denies any abdominal pain, vomiting.  Past Medical History  Diagnosis Date  . Anxiety     History reviewed. No pertinent past surgical history.  History reviewed. No pertinent family history.  History  Substance Use Topics  . Smoking status: Current Everyday Smoker  . Smokeless tobacco: Not on file  . Alcohol Use: Yes      Review of Systems  All other systems reviewed and are negative.    Allergies  Review of patient's allergies indicates no known allergies.  Home Medications  No current outpatient prescriptions on file.  There were no vitals taken for this visit.  Physical Exam  Nursing note and vitals reviewed. Constitutional: He is oriented to person, place, and time. Vital signs are normal. He appears well-developed and well-nourished.  Non-toxic appearance. No distress.  HENT:  Head: Normocephalic and atraumatic.  Eyes: Conjunctivae and EOM are normal. Pupils are equal, round, and reactive to light.  Neck: Normal range of motion. Neck supple. No tracheal deviation present.  Cardiovascular: Normal rate, regular rhythm and normal heart sounds.  Exam reveals no gallop.   No murmur heard. Pulmonary/Chest: Effort normal and breath sounds normal. No  stridor. No respiratory distress. He has no wheezes.  Abdominal: Soft. Normal appearance and bowel sounds are normal. He exhibits no distension. There is no tenderness. There is no rebound.  Musculoskeletal: Normal range of motion. He exhibits no edema and no tenderness.  Neurological: He is alert and oriented to person, place, and time. He has normal strength. No cranial nerve deficit or sensory deficit. GCS eye subscore is 4. GCS verbal subscore is 5. GCS motor subscore is 6.  Skin: Skin is warm and dry.  Psychiatric: His speech is normal. His affect is blunt. He exhibits a depressed mood.    ED Course  Procedures (including critical care time)  Labs Reviewed  CBC - Abnormal; Notable for the following:    MCH 34.2 (*)    MCHC 36.3 (*)    All other components within normal limits  COMPREHENSIVE METABOLIC PANEL - Abnormal; Notable for the following:    AST 93 (*) SLIGHT HEMOLYSIS   ALT 108 (*)    Total Bilirubin 0.2 (*)    All other components within normal limits  ETHANOL - Abnormal; Notable for the following:    Alcohol, Ethyl (B) 314 (*)    All other components within normal limits  URINE RAPID DRUG SCREEN (HOSP PERFORMED)   No results found.   No diagnosis found.    MDM  Pt accepted back at [facility], medically cleared        Toy Baker, MD 04/13/11 [phone]

## 2011-04-13 NOTE — ED Notes (Signed)
Talked with Adam Riley from [facility] and she said we will need to call [facility] to bring pt back to facility

## 2011-04-13 NOTE — Progress Notes (Signed)
Assessment Note   Adam Riley is an 35 y.o. male who presents to [facility] for detox.  Please see assessment.  Patient returned to ed after being seen earlier on 04/12/11 for detox.  Patient was offered detox program at [facility] but declined.  Patient reports drinking 1 case daily and says his drinking increases around the holidays.  Patient reports slight tremors, no others w/d sxs at this time.  Pt. Info referred to [facility] and [facility].    Axis I Alcohol Dep 303.9  Axis II Deferred   Axis III None Reported   Axis IV SA, Support  Axis V  48   Past Medical History:  Past Medical History  Diagnosis Date  . Anxiety   . Depression     History reviewed. No pertinent past surgical history.  Family History: History reviewed. No pertinent family history.  Social History:  reports that he has been smoking Cigarettes.  He has a 15 pack-year smoking history. He does not have any smokeless tobacco history on file. He reports that he drinks about 75.6 ounces of alcohol per week. He reports that he does not use illicit drugs.  Allergies: No Known Allergies  Home Medications:  Medications Prior to Admission  Medication Dose Route Frequency Provider Last Rate Last Dose  . alum & mag hydroxide-simeth (MAALOX/MYLANTA) 200-200-20 MG/5ML suspension 30 mL  30 mL Oral PRN Toy Baker, MD      . folic acid (FOLVITE) tablet 1 mg  1 mg Oral Daily Toy Baker, MD      . ibuprofen (ADVIL,MOTRIN) tablet 600 mg  600 mg Oral Q8H PRN Toy Baker, MD      . LORazepam (ATIVAN) tablet 1 mg  1 mg Oral Q6H PRN Toy Baker, MD       Or  . LORazepam (ATIVAN) injection 1 mg  1 mg Intravenous Q6H PRN Toy Baker, MD      . multivitamins ther. w/minerals tablet 1 tablet  1 tablet Oral Daily Toy Baker, MD      . nicotine (NICODERM CQ - dosed in mg/24 hours) patch 21 mg  21 mg Transdermal Daily Toy Baker, MD      . ondansetron [facility]) tablet 4 mg  4 mg Oral Q8H PRN Toy Baker, MD      . vitamin  B-1 tablet 100 mg  100 mg Oral Daily Toy Baker, MD       Or  . thiamine (B-1) injection 100 mg  100 mg Intravenous Daily Toy Baker, MD      . zolpidem [facility]) tablet 5 mg  5 mg Oral QHS PRN Toy Baker, MD       No current outpatient prescriptions on file as of 04/12/2011.    OB/GYN Status:  No LMP for male patient.  General Assessment Data Assessment Number: 1  Living Arrangements: Homeless Can pt return to current living arrangement?: Yes Admission Status: Voluntary Is patient capable of signing voluntary admission?: Yes Transfer from: Acute Hospital Referral Source: MD  Risk to self Suicidal Ideation: No Suicidal Intent: No Is patient at risk for suicide?: No Suicidal Plan?: No Access to Means: No Specify Access to Suicidal Means: NONE  What has been your use of drugs/alcohol within the last 12 months?: 1 CASE DAILY  Other Self Harm Risks: NONE  Triggers for Past Attempts: Other (Comment) ("I DRINK AROUND HOLIDAYS") Intentional Self Injurious Behavior: None Factors that decrease suicide risk: Absense of  psychosis Family Suicide History: No Recent stressful life event(s): Other (Comment) (NONE REPORTED ) Persecutory voices/beliefs?: No Depression Symptoms: Loss of interest in usual pleasures Substance abuse history and/or treatment for substance abuse?: Yes Suicide prevention information given to non-admitted patients: Not applicable  Risk to Others Homicidal Ideation: No Thoughts of Harm to Others: No Current Homicidal Intent: No Current Homicidal Plan: No Access to Homicidal Means: No Identified Victim: NONE  History of harm to others?: No Assessment of Violence: None Noted Violent Behavior Description: NONE  Does patient have access to weapons?: No Criminal Charges Pending?: No Does patient have a court date: No  Mental Status Report Appear/Hygiene: Disheveled;Poor hygiene Eye Contact: Fair Motor Activity: Unremarkable Speech:  Logical/coherent Level of Consciousness: Alert Mood: Depressed Affect: Depressed Anxiety Level: None Thought Processes: Coherent;Relevant Judgement: Unimpaired Orientation: Person;Place;Time;Situation Obsessive Compulsive Thoughts/Behaviors: None  Cognitive Functioning Concentration: Normal Memory: Recent Intact;Remote Intact IQ: Average Insight: Fair Impulse Control: Fair Appetite: Fair Weight Loss: 0  Weight Gain: 0  Sleep: No Change Total Hours of Sleep: 8  Vegetative Symptoms: None  Prior Inpatient/Outpatient Therapy Prior Therapy: Inpatient Prior Therapy Dates: 2009-2012 Prior Therapy Facilty/Provider(s): [facility] (2009-2012) Reason for Treatment: DETOX  ADL Screening (condition at time of admission) Patient's cognitive ability adequate to safely complete daily activities?: Yes Patient able to express need for assistance with ADLs?: Yes Independently performs ADLs?: Yes Weakness of Legs: None Weakness of Arms/Hands: None       Abuse/Neglect Assessment (Assessment to be complete while patient is alone) Physical Abuse: Denies Verbal Abuse: Denies Sexual Abuse: Denies Exploitation of patient/patient's resources: Denies Self-Neglect: Denies     Merchant navy officer (For Healthcare) Advance Directive: Patient does not have advance directive;Patient would not like information    Additional Information 1:1 In Past 12 Months?: No CIRT Risk: No Elopement Risk: No Does patient have medical clearance?: Yes     Disposition:  Disposition Disposition of Patient: Referred to Patient referred to: [facility]  On Site Evaluation by:   Reviewed with Physician:     Liliane Bade 04/13/2011 5:48 AM

## 2011-04-13 NOTE — Progress Notes (Signed)
Assessment Note   Adam Riley is an 35 y.o. male.   Axis I: Alcohol Dep 303.9 Axis II: Deferred Axis III: None Reported Past Medical History  Diagnosis Date  . Anxiety   . Depression     Past Medical History:  Past Medical History  Diagnosis Date  . Anxiety   . Depression     History reviewed. No pertinent past surgical history.  Family History: History reviewed. No pertinent family history.  Social History:  reports that he has been smoking Cigarettes.  He has a 15 pack-year smoking history. He does not have any smokeless tobacco history on file. He reports that he drinks about 75.6 ounces of alcohol per week. He reports that he does not use illicit drugs.  Allergies: No Known Allergies  Home Medications:  Medications Prior to Admission  Medication Dose Route Frequency Provider Last Rate Last Dose  . alum & mag hydroxide-simeth (MAALOX/MYLANTA) 200-200-20 MG/5ML suspension 30 mL  30 mL Oral PRN Toy Baker, MD      . folic acid (FOLVITE) tablet 1 mg  1 mg Oral Daily Toy Baker, MD      . ibuprofen (ADVIL,MOTRIN) tablet 600 mg  600 mg Oral Q8H PRN Toy Baker, MD      . LORazepam (ATIVAN) tablet 1 mg  1 mg Oral Q6H PRN Toy Baker, MD       Or  . LORazepam (ATIVAN) injection 1 mg  1 mg Intravenous Q6H PRN Toy Baker, MD      . multivitamins ther. w/minerals tablet 1 tablet  1 tablet Oral Daily Toy Baker, MD      . nicotine (NICODERM CQ - dosed in mg/24 hours) patch 21 mg  21 mg Transdermal Daily Toy Baker, MD      . ondansetron [facility]) tablet 4 mg  4 mg Oral Q8H PRN Toy Baker, MD      . vitamin B-1 tablet 100 mg  100 mg Oral Daily Toy Baker, MD       Or  . thiamine (B-1) injection 100 mg  100 mg Intravenous Daily Toy Baker, MD      . zolpidem [facility]) tablet 5 mg  5 mg Oral QHS PRN Toy Baker, MD       No current outpatient prescriptions on file as of 04/12/2011.    OB/GYN Status:  No LMP for male  patient.  General Assessment Data Assessment Number: 1  Living Arrangements: Homeless Can pt return to current living arrangement?: Yes Admission Status: Voluntary Is patient capable of signing voluntary admission?: Yes Transfer from: Acute Hospital Referral Source: MD  Risk to self Suicidal Ideation: No Suicidal Intent: No Is patient at risk for suicide?: No Suicidal Plan?: No Access to Means: No Specify Access to Suicidal Means: NONE  What has been your use of drugs/alcohol within the last 12 months?: 1 CASE DAILY  Other Self Harm Risks: NONE  Triggers for Past Attempts: Other (Comment) ("I DRINK AROUND HOLIDAYS") Intentional Self Injurious Behavior: None Factors that decrease suicide risk: Absense of psychosis Family Suicide History: No Recent stressful life event(s): Other (Comment) (NONE REPORTED ) Persecutory voices/beliefs?: No Depression Symptoms: Loss of interest in usual pleasures Substance abuse history and/or treatment for substance abuse?: Yes Suicide prevention information given to non-admitted patients: Not applicable  Risk to Others Homicidal Ideation: No Thoughts of Harm to Others: No Current Homicidal Intent: No Current Homicidal Plan: No Access to Homicidal Means:  No Identified Victim: NONE  History of harm to others?: No Assessment of Violence: None Noted Violent Behavior Description: NONE  Does patient have access to weapons?: No Criminal Charges Pending?: No Does patient have a court date: No  Mental Status Report Appear/Hygiene: Disheveled;Poor hygiene Eye Contact: Fair Motor Activity: Unremarkable Speech: Logical/coherent Level of Consciousness: Alert Mood: Depressed Affect: Depressed Anxiety Level: None Thought Processes: Coherent;Relevant Judgement: Unimpaired Orientation: Person;Place;Time;Situation Obsessive Compulsive Thoughts/Behaviors: None  Cognitive Functioning Concentration: Normal Memory: Recent Intact;Remote Intact IQ:  Average Insight: Fair Impulse Control: Fair Appetite: Fair Weight Loss: 0  Weight Gain: 0  Sleep: No Change Total Hours of Sleep: 8  Vegetative Symptoms: None  Prior Inpatient/Outpatient Therapy Prior Therapy: Inpatient Prior Therapy Dates: 2009-2012 Prior Therapy Facilty/Provider(s): [facility] (2009-2012) Reason for Treatment: DETOX  ADL Screening (condition at time of admission) Patient's cognitive ability adequate to safely complete daily activities?: Yes Patient able to express need for assistance with ADLs?: Yes Independently performs ADLs?: Yes Weakness of Legs: None Weakness of Arms/Hands: None       Abuse/Neglect Assessment (Assessment to be complete while patient is alone) Physical Abuse: Denies Verbal Abuse: Denies Sexual Abuse: Denies Exploitation of patient/patient's resources: Denies Self-Neglect: Denies     Merchant navy officer (For Healthcare) Advance Directive: Patient does not have advance directive;Patient would not like information    Additional Information 1:1 In Past 12 Months?: No CIRT Risk: No Elopement Risk: No Does patient have medical clearance?: Yes     Disposition:  Disposition Disposition of Patient: Referred to Patient referred to: [facility]  On Site Evaluation by:   Reviewed with Physician:     Liliane Bade 04/13/2011 5:30 AM

## 2011-04-13 NOTE — ED Notes (Signed)
Pt in from [facility] for medical clearance, pt alcohol level too high for [facility] to accept at this time, pt is to return to [facility] once ETOH level is decreased

## 2011-04-13 NOTE — ED Notes (Signed)
Spoke with Carly from [facility], states patient can return to [facility] after ETOH level is less than 200. MD Freida Busman aware.

## 2011-04-30 ENCOUNTER — Emergency Department (HOSPITAL_COMMUNITY)
Admission: EM | Admit: 2011-04-30 | Discharge: 2011-04-30 | Disposition: A | Discharge status: Home / Self Care | Payer: Self-pay | Attending: Emergency Medicine | Admitting: Emergency Medicine

## 2011-04-30 ENCOUNTER — Encounter (HOSPITAL_COMMUNITY): Payer: Self-pay | Admitting: *Deleted

## 2011-04-30 DIAGNOSIS — Y9229 Other specified public building as the place of occurrence of the external cause: Secondary | ICD-10-CM | POA: Insufficient documentation

## 2011-04-30 DIAGNOSIS — F17200 Nicotine dependence, unspecified, uncomplicated: Secondary | ICD-10-CM | POA: Insufficient documentation

## 2011-04-30 DIAGNOSIS — F3289 Other specified depressive episodes: Secondary | ICD-10-CM | POA: Insufficient documentation

## 2011-04-30 DIAGNOSIS — S01409A Unspecified open wound of unspecified cheek and temporomandibular area, initial encounter: Secondary | ICD-10-CM | POA: Insufficient documentation

## 2011-04-30 DIAGNOSIS — Z7289 Other problems related to lifestyle: Secondary | ICD-10-CM

## 2011-04-30 DIAGNOSIS — F329 Major depressive disorder, single episode, unspecified: Secondary | ICD-10-CM | POA: Insufficient documentation

## 2011-04-30 DIAGNOSIS — R45851 Suicidal ideations: Secondary | ICD-10-CM | POA: Insufficient documentation

## 2011-04-30 DIAGNOSIS — X789XXA Intentional self-harm by unspecified sharp object, initial encounter: Secondary | ICD-10-CM | POA: Insufficient documentation

## 2011-04-30 DIAGNOSIS — F10929 Alcohol use, unspecified with intoxication, unspecified: Secondary | ICD-10-CM

## 2011-04-30 DIAGNOSIS — S51809A Unspecified open wound of unspecified forearm, initial encounter: Secondary | ICD-10-CM | POA: Insufficient documentation

## 2011-04-30 DIAGNOSIS — F411 Generalized anxiety disorder: Secondary | ICD-10-CM | POA: Insufficient documentation

## 2011-04-30 DIAGNOSIS — S1190XA Unspecified open wound of unspecified part of neck, initial encounter: Secondary | ICD-10-CM | POA: Insufficient documentation

## 2011-04-30 DIAGNOSIS — F1010 Alcohol abuse, uncomplicated: Secondary | ICD-10-CM | POA: Insufficient documentation

## 2011-04-30 LAB — RAPID URINE DRUG SCREEN, HOSP PERFORMED
Benzodiazepines: NOT DETECTED
Cocaine: NOT DETECTED
Opiates: NOT DETECTED

## 2011-04-30 LAB — COMPREHENSIVE METABOLIC PANEL
BUN: 10 mg/dL (ref 6–23)
CO2: 26 mEq/L (ref 19–32)
Calcium: 9.4 mg/dL (ref 8.4–10.5)
Creatinine, Ser: 0.94 mg/dL (ref 0.50–1.35)
GFR calc Af Amer: >90 mL/min (ref >90)
GFR calc non Af Amer: >90 mL/min (ref >90)
Glucose, Bld: 88 mg/dL (ref 70–99)

## 2011-04-30 LAB — CBC
HCT: 44.9 % (ref 39.0–52.0)
Hemoglobin: 15.5 g/dL (ref 13.0–17.0)
MCV: 95.9 fL (ref 78.0–100.0)
RDW: 12.4 % (ref 11.5–15.5)

## 2011-04-30 LAB — DIFFERENTIAL
Basophils Absolute: 0 10*3/uL (ref 0.0–0.1)
Eosinophils Relative: 1 % (ref 0–5)
Lymphocytes Relative: 31 % (ref 12–46)
Monocytes Absolute: 0.6 10*3/uL (ref 0.1–1.0)
Monocytes Relative: 6 % (ref 3–12)

## 2011-04-30 MED ORDER — LORAZEPAM 2 MG/ML IJ SOLN: 1.0000 mg | Freq: Four times a day (QID) | INTRAMUSCULAR | Status: DC | PRN

## 2011-04-30 MED ORDER — LORAZEPAM 1 MG PO TABS: 0.0000 mg | ORAL_TABLET | Freq: Two times a day (BID) | ORAL | Status: DC

## 2011-04-30 MED ORDER — ACETAMINOPHEN 325 MG PO TABS: 650.0000 mg | ORAL_TABLET | ORAL | Status: DC | PRN

## 2011-04-30 MED ORDER — THIAMINE HCL 100 MG/ML IJ SOLN: 100.0000 mg | Freq: Every day | INTRAMUSCULAR | Status: DC

## 2011-04-30 MED ORDER — LORAZEPAM 1 MG PO TABS: 1.0000 mg | ORAL_TABLET | Freq: Three times a day (TID) | ORAL | Status: DC | PRN

## 2011-04-30 MED ORDER — FOLIC ACID 1 MG PO TABS
1.0000 mg | ORAL_TABLET | Freq: Every day | ORAL | Status: DC
  Administered 2011-04-30: 1 mg via ORAL
  Filled 2011-04-30: qty 1

## 2011-04-30 MED ORDER — VITAMIN B-1 100 MG PO TABS
100.0000 mg | ORAL_TABLET | Freq: Every day | ORAL | Status: DC
  Administered 2011-04-30: 100 mg via ORAL
  Filled 2011-04-30: qty 1

## 2011-04-30 MED ORDER — LORAZEPAM 1 MG PO TABS: 0.0000 mg | ORAL_TABLET | Freq: Four times a day (QID) | ORAL | Status: DC

## 2011-04-30 MED ORDER — ALUM & MAG HYDROXIDE-SIMETH 200-200-20 MG/5ML PO SUSP: 30.0000 mL | ORAL | Status: DC | PRN

## 2011-04-30 MED ORDER — NICOTINE 21 MG/24HR TD PT24: 21.0000 mg | MEDICATED_PATCH | Freq: Every day | TRANSDERMAL | Status: DC

## 2011-04-30 MED ORDER — THERA M PLUS PO TABS
1.0000 | ORAL_TABLET | Freq: Every day | ORAL | Status: DC
  Administered 2011-04-30: 12:00:00 via ORAL
  Filled 2011-04-30: qty 1

## 2011-04-30 MED ORDER — ONDANSETRON HCL 4 MG PO TABS: 4.0000 mg | ORAL_TABLET | Freq: Three times a day (TID) | ORAL | Status: DC | PRN

## 2011-04-30 NOTE — ED Notes (Signed)
When assessing pt pt repeating self telling me that he is going to kill himself pt unable to tell me the events of the evening that brought him here to the ED when asked how much he had to drink pt repeats I don't know when asked what he was drinking pt reports I don't know pt now resting in the bed pt awake asking this recorder to have the officers handcuff him to the bed so he wont "do it"

## 2011-04-30 NOTE — ED Notes (Signed)
Pt in via EMS with [employer] after self inflicted lacerations, pt verbalized SI

## 2011-04-30 NOTE — ED Provider Notes (Signed)
History     CSN: [id number] Arrival date & time: 04/30/2011 12:21 AM   None     Chief Complaint  Patient presents with  . Medical Clearance  . Laceration    (Consider location/radiation/quality/duration/timing/severity/associated sxs/prior treatment) HPI Comments: The patient presents in the custody of [facility] police department. The patient intentionally cut himself several times on his upper extremities, neck, and face tonight with a razor blade. Per the police, they were called by a bystander. Patient reports that he wants to die and this is why he cut himself. He states that he was drinking alcohol earlier this evening as well. He denies other drugs. He states that he hears things that are not voices. He denies past history of psychological problems.  Patient is a 35 y.o. male presenting with skin laceration. The history is provided by the patient and the police.  Laceration  The incident occurred 1 to 2 hours ago. The laceration is located on the left arm, right arm and face. The laceration mechanism was a a razor.    Past Medical History  Diagnosis Date  . Anxiety   . Depression     History reviewed. No pertinent past surgical history.  History reviewed. No pertinent family history.  History  Substance Use Topics  . Smoking status: Current Everyday Smoker -- 1.0 packs/day for 15 years    Types: Cigarettes  . Smokeless tobacco: Not on file  . Alcohol Use: 75.6 oz/week    126 Cans of beer per week      Review of Systems  Constitutional: Negative for fever and chills.  HENT: Negative for sore throat and rhinorrhea.   Eyes: Negative for redness.  Respiratory: Negative for shortness of breath.   Cardiovascular: Negative for chest pain.  Gastrointestinal: Negative for nausea, vomiting, abdominal pain, diarrhea and constipation.  Genitourinary: Negative for dysuria.  Musculoskeletal: Negative for myalgias.  Skin: Positive for wound. Negative for rash.    Neurological: Negative for dizziness and headaches.  Psychiatric/Behavioral: Positive for suicidal ideas and hallucinations.    Allergies  Review of patient's allergies indicates no known allergies.  Home Medications  No current outpatient prescriptions on file.  BP 135/65  Pulse 85  Temp(Src) 98.5 F (36.9 C) (Oral)  Resp 16  SpO2 100%  Physical Exam  Nursing note and vitals reviewed. Constitutional: He is oriented to person, place, and time. He appears well-developed and well-nourished.  HENT:  Head: Normocephalic.       Patient with several superficial abrasions to both cheeks.  Eyes: Conjunctivae are normal. Pupils are equal, round, and reactive to light. Right eye exhibits no discharge. Left eye exhibits no discharge.  Neck: Normal range of motion. Neck supple.       Patient with superficial abrasion to right neck.  Cardiovascular: Normal rate, regular rhythm and normal heart sounds.   Pulmonary/Chest: Effort normal and breath sounds normal.  Abdominal: Soft. Bowel sounds are normal. There is no tenderness. There is no rebound and no guarding.  Musculoskeletal: Normal range of motion. He exhibits tenderness. He exhibits no edema.       Patient with several superficial abrasions to both the right and left forearms.  Neurological: He is alert and oriented to person, place, and time.  Skin: Skin is warm.       As noted in previous exam.  Psychiatric: He expresses suicidal ideation. He expresses suicidal plans.    ED Course  Procedures (including critical care time)  Results for orders placed during the  hospital encounter of 04/30/11  CBC      Component Value Range   WBC 9.8  4.0 - 10.5 (K/uL)   RBC 4.68  4.22 - 5.81 (MIL/uL)   Hemoglobin 15.5  13.0 - 17.0 (g/dL)   HCT 16.1  09.6 - 04.5 (%)   MCV 95.9  78.0 - 100.0 (fL)   MCH 33.1  26.0 - 34.0 (pg)   MCHC 34.5  30.0 - 36.0 (g/dL)   RDW 40.9  81.1 - 91.4 (%)   Platelets 340  150 - 400 (K/uL)  DIFFERENTIAL       Component Value Range   Neutrophils Relative 62  43 - 77 (%)   Neutro Abs 6.0  1.7 - 7.7 (K/uL)   Lymphocytes Relative 31  12 - 46 (%)   Lymphs Abs 3.1  0.7 - 4.0 (K/uL)   Monocytes Relative 6  3 - 12 (%)   Monocytes Absolute 0.6  0.1 - 1.0 (K/uL)   Eosinophils Relative 1  0 - 5 (%)   Eosinophils Absolute 0.1  0.0 - 0.7 (K/uL)   Basophils Relative 0  0 - 1 (%)   Basophils Absolute 0.0  0.0 - 0.1 (K/uL)  COMPREHENSIVE METABOLIC PANEL      Component Value Range   Sodium 140  135 - 145 (mEq/L)   Potassium 4.0  3.5 - 5.1 (mEq/L)   Chloride 101  96 - 112 (mEq/L)   CO2 26  19 - 32 (mEq/L)   Glucose, Bld 88  70 - 99 (mg/dL)   BUN 10  6 - 23 (mg/dL)   Creatinine, Ser 7.82  0.50 - 1.35 (mg/dL)   Calcium 9.4  8.4 - 95.6 (mg/dL)   Total Protein 7.9  6.0 - 8.3 (g/dL)   Albumin 3.9  3.5 - 5.2 (g/dL)   AST 213 (*) 0 - 37 (U/L)   ALT 728 (*) 0 - 53 (U/L)   Alkaline Phosphatase 91  39 - 117 (U/L)   Total Bilirubin 0.3  0.3 - 1.2 (mg/dL)   GFR calc non Af Amer >90  >90 (mL/min)   GFR calc Af Amer >90  >90 (mL/min)  URINE RAPID DRUG SCREEN (HOSP PERFORMED)      Component Value Range   Opiates NONE DETECTED  NONE DETECTED    Cocaine NONE DETECTED  NONE DETECTED    Benzodiazepines NONE DETECTED  NONE DETECTED    Amphetamines NONE DETECTED  NONE DETECTED    Tetrahydrocannabinol NONE DETECTED  NONE DETECTED    Barbiturates NONE DETECTED  NONE DETECTED   ETHANOL      Component Value Range   Alcohol, Ethyl (B) 349 (*) 0 - 11 (mg/dL)   No results found.     12:33 AM patient seen and examined. All wounds were evaluated. All wounds are superficial in nature. They are consistent with being caused by a cartridge razor blade.  Labs ordered. At this time the patient is here voluntarily and states that he wants help. He will not be able to leave without psychiatric evaluation. Holding orders written. Plan: Will contact act team when labs return.  12:52 AM I have spoken with the act team. They will  evaluate patient.  Handoff to Dr. Dierdre Highman at 0100.   MDM  SI, possible psychosis.    Medical screening examination/treatment/procedure(s) were conducted as a shared visit with non-physician practitioner(s) and myself.  I personally evaluated the patient during the encounter. By history he has self-inflicted wounds as above with suicidal ideation  stated. On exam has superficial abrasions to the face and upper torso. Wounds cleaned and irrigated in bacitracin applied.  No deep lacerations or active bleeding.     Eustace Moore [place], [place] 04/30/11 [id number]  Sunnie Nielsen, MD 04/30/11 [phone]

## 2011-04-30 NOTE — Discharge Instructions (Signed)
Finding Treatment for Alcohol and Drug Addiction It can be hard to find the right place to get professional treatment. Here are some important things to consider:  There are different types of treatment to choose from.   Some programs are live-in (residential) while others are not (outpatient). Sometimes a combination is offered.   No single type of program is right for everyone.   Most treatment programs involve a combination of education, counseling, and a 12-step, spiritually-based approach.   There are non-spiritually based programs (not 12-step).   Some treatment programs are government sponsored. They are geared for patients without private insurance.   Treatment programs can vary in many respects such as:   Cost and types of insurance accepted.   Types of on-site medical services offered.   Length of stay, setting, and size.   Overall philosophy of treatment.  A person may need specialized treatment or have needs not addressed by all programs. For example, adolescents need treatment appropriate for their age. Other people have secondary disorders that must be managed as well. Secondary conditions can include mental illness, such as depression or diabetes. Often, a period of detoxification from alcohol or drugs is needed. This requires medical supervision and not all programs offer this. THINGS TO CONSIDER WHEN SELECTING A TREATMENT PROGRAM   Is the program certified by the appropriate government agency? Even private programs must be certified and employ certified professionals.   Does the program accept your insurance? If not, can a payment plan be set up?   Is the facility clean, organized, and well run? Do they allow you to speak with graduates who can share their treatment experience with you? Can you tour the facility? Can you meet with staff?   Does the program meet the full range of individual needs?   Does the treatment program address sexual orientation and physical  disabilities? Do they provide age, gender, and culturally appropriate treatment services?   Is treatment available in languages other than English?   Is long-term aftercare support or guidance encouraged and provided?   Is assessment of an individual's treatment plan ongoing to ensure it meets changing needs?   Does the program use strategies to encourage reluctant patients to remain in treatment long enough to increase the likelihood of success?   Does the program offer counseling (individual or group) and other behavioral therapies?   Does the program offer medicine as part of the treatment regimen, if needed?   Is there ongoing monitoring of possible relapse? Is there a defined relapse prevention program? Are services or referrals offered to family members to ensure they understand addiction and the recovery process? This would help them support the recovering individual.   Are 12-step meetings held at the center or is transport available for patients to attend outside meetings?  In countries outside of the [place]. and [place], Magazine features editor for contact information for services in your area. Document Released: 04/22/2005 Document Revised: 02/03/2011 Document Reviewed: 11/02/2007 [facility] Patient Information 2012 [place], [place].Alcohol Intoxication You have alcohol intoxication when the amount of alcohol that you have consumed has impaired your ability to mentally and physically function. There are a variety of factors that contribute to the level at which alcohol intoxication can occur, such as age, gender, weight, frequency of alcohol consumption, medication use, and the presence of other medical conditions, such as diabetes, seizures, or heart conditions. The blood alcohol level test measures the concentration of alcohol in your blood. In most states, your blood alcohol level  must be lower than 80 mg/dL (1.61%) to legally drive. However, many dangerous effects of alcohol can occur at  much lower levels. Alcohol directly impairs the normal chemical activity of the brain and is said to be a chemical depressant. Alcohol can cause drowsiness, stupor, respiratory failure, and coma. Other physical effects can include headache, vomiting, vomiting of blood, abdominal pain, a fast heartbeat, difficulty breathing, anxiety, and amnesia. Alcohol intoxication can also lead to dangerous and life-threatening activities, such as fighting, dangerous operation of vehicles or heavy machinery, and risky sexual behavior. Alcohol can be especially dangerous when taken with other drugs. Some of these drugs are:  Sedatives.   Painkillers.   Marijuana.   Tranquilizers.   Antihistamines.   Muscle relaxants.   Seizure medicine.  Many of the effects of acute alcohol intoxication are temporary. However, repeated alcohol intoxication can lead to severe medical illnesses. If you have alcohol intoxication, you should:  Stay hydrated. Drink enough water and fluids to keep your urine clear or pale yellow. Avoid excessive caffeine because this can further lead to dehydration.   Eat a healthy diet. You may have residual nausea, headache, and loss of appetite, but it is still important that you maintain good nutrition. You can start with clear liquids.   Take nonsteroidal anti-inflammatory medications as needed for headaches, but make sure to do so with small meals. You should avoid acetaminophen for several days after having alcohol intoxication because the combination of alcohol and acetaminophen can be toxic to your liver.  If you have frequent alcohol intoxication, ask your friends and family if they think you have a drinking problem. For further help, contact:  Your caregiver.   Alcoholics Anonymous (AA).   A drug or alcohol rehabilitation program.  SEEK MEDICAL CARE IF:   You have persistent vomiting.   You have persistent pain in any part of your body.   You do not feel better after a few  days.  SEEK IMMEDIATE MEDICAL CARE IF:   You become shaky or tremble when you try to stop drinking.   You shake uncontrollably (seizure).   You throw up (vomit) blood. This may be bright red or it may look like black coffee grounds.   You have blood in the stool. This may be bright red or appear as a black, tarry, bad smelling stool.   You become lightheaded or faint.  ANY OF THESE SYMPTOMS MAY REPRESENT A SERIOUS PROBLEM THAT IS AN EMERGENCY. Do not wait to see if the symptoms will go away. Get medical help right away. Call your local emergency services (911 in [place] DO NOT drive yourself to the hospital. MAKE SURE YOU:   Understand these instructions.   Will watch your condition.   Will get help right away if you are not doing well or get worse.  Document Released: 03/03/2005 Document Revised: 02/03/2011 Document Reviewed: 11/10/2009 [facility] Patient Information 2012 [place], [place].Alcohol Problems Most adults who drink alcohol drink in moderation (not a lot) are at low risk for developing problems related to their drinking. However, all drinkers, including low-risk drinkers, should know about the health risks connected with drinking alcohol. RECOMMENDATIONS FOR LOW-RISK DRINKING  Drink in moderation. Moderate drinking is defined as follows:   Men - no more than 2 drinks per day.   Nonpregnant women - no more than 1 drink per day.   Over age 52 - no more than 1 drink per day.  A standard drink is 12 grams of pure alcohol, which  is equal to a 12 ounce bottle of beer or wine cooler, a 5 ounce glass of wine, or 1.5 ounces of distilled spirits (such as whiskey, brandy, vodka, or rum).  ABSTAIN FROM (DO NOT DRINK) ALCOHOL:  When pregnant or considering pregnancy.   When taking a medication that interacts with alcohol.   If you are alcohol dependent.   A medical condition that prohibits drinking alcohol (such as ulcer, liver disease, or heart disease).  DISCUSS WITH YOUR  CAREGIVER:  If you are at risk for coronary heart disease, discuss the potential benefits and risks of alcohol use: Light to moderate drinking is associated with lower rates of coronary heart disease in certain populations (for example, men over age 26 and postmenopausal women). Infrequent or nondrinkers are advised not to begin light to moderate drinking to reduce the risk of coronary heart disease so as to avoid creating an alcohol-related problem. Similar protective effects can likely be gained through proper diet and exercise.   Women and the elderly have smaller amounts of body water than men. As a result women and the elderly achieve a higher blood alcohol concentration after drinking the same amount of alcohol.   Exposing a fetus to alcohol can cause a broad range of birth defects referred to as Fetal Alcohol Syndrome (FAS) or Alcohol-Related Birth Defects (ARBD). Although FAS/ARBD is connected with excessive alcohol consumption during pregnancy, studies also have reported neurobehavioral problems in infants born to mothers reporting drinking an average of 1 drink per day during pregnancy.   Heavier drinking (the consumption of more than 4 drinks per occasion by men and more than 3 drinks per occasion by women) impairs learning (cognitive) and psychomotor functions and increases the risk of alcohol-related problems, including accidents and injuries.  CAGE QUESTIONS:   Have you ever felt that you should Cut down on your drinking?   Have people Annoyed you by criticizing your drinking?   Have you ever felt bad or Guilty about your drinking?   Have you ever had a drink first thing in the morning to steady your nerves or get rid of a hangover (Eye opener)?  If you answered positively to any of these questions: You may be at risk for alcohol-related problems if alcohol consumption is:   Men: Greater than 14 drinks per week or more than 4 drinks per occasion.   Women: Greater than 7 drinks per  week or more than 3 drinks per occasion.  Do you or your family have a medical history of alcohol-related problems, such as:  Blackouts.   Sexual dysfunction.   Depression.   Trauma.   Liver dysfunction.   Sleep disorders.   Hypertension.   Chronic abdominal pain.   Has your drinking ever caused you problems, such as problems with your family, problems with your work (or school) performance, or accidents/injuries?   Do you have a compulsion to drink or a preoccupation with drinking?   Do you have poor control or are you unable to stop drinking once you have started?   Do you have to drink to avoid withdrawal symptoms?   Do you have problems with withdrawal such as tremors, nausea, sweats, or mood disturbances?   Does it take more alcohol than in the past to get you high?   Do you feel a strong urge to drink?   Do you change your plans so that you can have a drink?   Do you ever drink in the morning to relieve the shakes or  a hangover?  If you have answered a number of the previous questions positively, it may be time for you to talk to your caregivers, family, and friends and see if they think you have a problem. Alcoholism is a chemical dependency that keeps getting worse and will eventually destroy your health and relationships. Many alcoholics end up dead, impoverished, or in prison. This is often the end result of all chemical dependency.  Do not be discouraged if you are not ready to take action immediately.   Decisions to change behavior often involve up and down desires to change and feeling like you cannot decide.   Try to think more seriously about your drinking behavior.   Think of the reasons to quit.  WHERE TO GO FOR ADDITIONAL INFORMATION   The National Institute on Alcohol Abuse and Alcoholism (NIAAA)www.niaaa.nih.gov   [employer] on Alcoholism and Drug Dependence (NCADD)www.ncadd.org   American Society of Addiction Medicine (ASAM)www.https://anderson-johnson.com/    Document Released: 05/24/2005 Document Revised: 02/03/2011 Document Reviewed: 01/10/2008 [facility] Patient Information 2012 [place], [place].Chemical Dependency Chemical dependency is an addiction to drugs or alcohol. It is characterized by the repeated behavior of seeking out and using drugs and alcohol despite harmful consequences to the health and safety of ones self and others.  RISK FACTORS There are certain situations or behaviors that increase a person's risk for chemical dependency. These include:  A family history of chemical dependency.   A history of mental health issues, including depression and anxiety.   A home environment where drugs and alcohol are easily available to you.   Drug or alcohol use at a young age.  SYMPTOMS  The following symptoms can indicate chemical dependency:  Inability to limit the use of drugs or alcohol.   Nausea, sweating, shakiness, and anxiety that occurs when alcohol or drugs are not being used.   An increase in amount of drugs or alcohol that is necessary to get drunk or high.  People who experience these symptoms can assess their use of drugs and alcohol by asking themselves the following questions:  Have you been told by friends or family that they are worried about your use of alcohol or drugs?   Do friends and family ever tell you about things you did while drinking alcohol or using drugs that you do not remember?   Do you lie about using alcohol or drugs or about the amounts you use?   Do you have difficulty completing daily tasks unless you use alcohol or drugs?   Is the level of your work or school performance lower because of your drug or alcohol use?   Do you get sick from using drugs or alcohol but keep using anyway?   Do you feel uncomfortable in social situations unless you use alcohol or drugs?   Do you use drugs or alcohol to help forget problems?  An answer of yes to any of these questions may indicate chemical  dependency. Professional evaluation is suggested. Document Released: 05/18/2001 Document Revised: 02/03/2011 Document Reviewed: 07/30/2010 [facility] Patient Information 2012 [place], [place].Depression, Adolescent and Adult Depression is a true and treatable medical condition. In general there are two kinds of depression:  Depression we all experience in some form. For example depression from the death of a loved one, financial distress or natural disasters will trigger or increase depression.   Clinical depression, on the other hand, appears without an apparent cause or reason. This depression is a disease. Depression may be caused by chemical imbalance in  the body and brain or may come as a response to a physical illness. Alcohol and other drugs can cause depression.  DIAGNOSIS  The diagnosis of depression is usually based upon symptoms and medical history. TREATMENT  Treatments for depression fall into three categories. These are:  Drug therapy. There are many medicines that treat depression. Responses may vary and sometimes trial and error is necessary to determine the best medicines and dosage for a particular patient.   Psychotherapy, also called talking treatments, helps people resolve their problems by looking at them from a different point of view and by giving people insight into their own personal makeup. Traditional psychotherapy looks at a childhood source of a problem. Other psychotherapy will look at current conflicts and move toward solving those. If the cause of depression is drug use, counseling is available to help abstain. In time the depression will usually improve. If there were underlying causes for the chemical use, they can be addressed.   ECT (electroconvulsive therapy) or shock treatment is not as commonly used today. It is a very effective treatment for severe suicidal depression. During ECT electrical impulses are applied to the head. These impulses cause a generalized  seizure. It can be effective but causes a loss of memory for recent events. Sometimes this loss of memory may include the last several months.  Treat all depression or suicide threats as serious. Obtain professional help. Do not wait to see if serious depression will get better over time without help. Seek help for yourself or those around you. In the [place] the number to the National Suicide Help Lines With 24 Hour Help Are: 1-800-SUICIDE 614-720-5021 Document Released: 05/21/2000 Document Revised: 02/03/2011 Document Reviewed: 01/10/2008 [facility] Patient Information 2012 [place], [place].Depression You have signs of depression. This is a common problem. It can occur at any age. It is often hard to recognize. People can suffer from depression and still have moments of enjoyment. Depression interferes with your basic ability to function in life. It upsets your relationships, sleep, eating, and work habits. CAUSES  Depression is believed to be caused by an imbalance in brain chemicals. It may be triggered by an unpleasant event. Relationship crises, a death in the family, financial worries, retirement, or other stressors are normal causes of depression. Depression may also start for no known reason. Other factors that may play a part include medical illnesses, some medicines, genetics, and alcohol or drug abuse. SYMPTOMS   Feeling unhappy or worthless.   Long-lasting (chronic) tiredness or worn-out feeling.   Self-destructive thoughts and actions.   Not being able to sleep or sleeping too much.   Eating more than usual or not eating at all.   Headaches or feeling anxious.   Trouble concentrating or making decisions.   Unexplained physical problems and substance abuse.  TREATMENT  Depression usually gets better with treatment. This can include:  Antidepressant medicines. It can take weeks before the proper dose is achieved and benefits are reached.   Talking with a therapist,  clergyperson, counselor, or friend. These people can help you gain insight into your problem and regain control of your life.   Eating a good diet.   Getting regular physical exercise, such as walking for 30 minutes every day.   Not abusing alcohol or drugs.  Treating depression often takes 6 months or longer. This length of treatment is needed to keep symptoms from returning. Call your caregiver and arrange for follow-up care as suggested. SEEK IMMEDIATE MEDICAL CARE IF:  You start to have thoughts of hurting yourself or others.   Call your local emergency services (911 in [place]   Go to your local medical emergency department.   Call the National Suicide Prevention Lifeline: 1-800-273-TALK (505)837-7761).  Document Released: 05/24/2005 Document Revised: 02/03/2011 Document Reviewed: 10/24/2009 [facility] Patient Information 2012 [place], [place].Self-Destructive Behavior Self-destructive behavior includes behaviors and attitudes that can harm, injure, or be destructive to oneself. Some examples of self-destructive behavior are:  Taking an overdose of pills.   Hurting yourself (self-injury behavior). This includes cutting, burning, or otherwise injuring yourself to release tension or as a misguided attempt to lessen emotional pain.   Getting into fights.   Purposely getting into accidents.   Staying in an emotionally or physically abusive relationship.   Shopping sprees, reckless spending, or gambling that causes you to go into debt.   Any type of addictive behavior. This includes acting out sexually, shoplifting, and other behaviors.   Binge eating and other eating disorders.   Not being able to set healthy limits. This may include depriving yourself of proper rest and nutrition in order to meet the demands of others.  The signs and symptoms above are serious forms of self-destructive behavior. These behaviors are commonly seen in people with clinical depression and low  self-esteem. See your doctor or counselor to help you recognize the source of your problems. They can help you sort out your feelings and get the help you need. You also need to avoid alcohol and drugs. If your behavior is out of control and your life is in danger, call:   The police.   Your doctor.   Your local emergency department (911 in the [place]  Document Released: 07/01/2004 Document Revised: 02/03/2011 Document Reviewed: 08/25/2009 [facility] Patient Information 2012 [place], [place].Suicidal Feelings, How to Help Yourself Everyone feels sad or unhappy at times, but depressing thoughts and feelings of hopelessness can lead to thoughts of suicide. It can seem as if life is too tough to handle. It is as if the mountain is just too high and your climbing skills are not great enough. At that moment these dark thoughts and feelings may seem overwhelming and never ending. It is important to remember these feelings are temporary! They will go away. If you feel as though you have reached the point where suicide is the only answer, it is time to let someone know immediately. This is the first step to feeling better. The following steps will move you to safer ground and lead you in a positive direction out of depression. HOW TO COPE AND PREVENT SUICIDE  Let family, friends, teachers and/or counselors know. Get help. Try not to isolate yourself from those who care about you. Even though you may not feel sociable or think that you are not good company, talk with someone everyday. It is best if it is face to face. Remember, they will want to help you.   Eat a regularly spaced and well-balanced diet, and get plenty of rest.   Avoid alcohol and drugs because they will only make you feel worse and may also lower your inhibitions. Remove them from the home. If you are thinking of taking an overdose of your prescribed medications, give your medicines to someone who can give them to you one day at a time. If you  are on antidepressants, let your caregiver know of your feelings so he or she can provide a safer medication, if that is a concern.   Remove weapons or poisons  from your home.   Try to stick to routines. That may mean just walking the dog or feeding the cat. Follow a schedule and remind yourself that you have to keep that schedule every day. Play with your pets. If it is possible, and you do not have a pet, get one. They give you a sense of well-being, lower your blood pressure and make your heart feel good. They need you, and we all want to be needed.   Set some realistic goals and achieve them. Make a list and cross things off as you go. Accomplishments give a sense of worth. Wait until you are feeling better before doing things you find difficult or unpleasant to do.   If you are able, try to start exercising. Even half-hour periods of exercise each day will make you feel better. Getting out in the sun or into nature helps you recover from depression faster. If you have a favorite place to walk, take advantage of that.   Increase safe activities that have always given you pleasure. This may include playing your favorite music, reading a good book, painting a picture or playing your favorite instrument. Do whatever takes your mind off your depression and puts a smile on your face.   Keep your living space well lit with windows open, and let the sun shine in. Bright light definitely treats depression, not just people with the seasonal affective disorders (SAD).  Above all else remember, depression is temporary. It will go away. Do not contemplate suicide. Death as a permanent solution is not the answer. Suicide will take away the beautiful rest of life, and do lifelong harm to those around you who love you. Help is available. National Suicide Help Lines with 24 hour help are: 1-800-SUICIDE 623-415-0478 Document Released: 11/28/2002 Document Revised: 02/03/2011 Document Reviewed:  04/18/2007 [facility] Patient Information 2012 [place], [place]. RESOURCE GUIDE  Dental Problems  Patients with Medicaid: [facility] [phone]                                           [phone] Phone:  [phone]                                                  Phone:  [phone]  If unable to pay or uninsured, contact:  [facility] or [facility]. to become qualified for the adult dental clinic.  Chronic Pain Problems Contact [facility]  [phone] Patients need to be referred by their primary care doctor.  Insufficient Money for Medicine Contact [employer]:  call "211" or [facility] [phone].  No Primary Care Doctor [facility]  [phone] Other agencies that provide inexpensive medical care    [facility]  [phone]    [facility] Internal Medicine  [phone]    [facility]  [phone]    [facility]  [phone]    [facility]  [phone]    [facility]  [phone]  Psychological Services [facility]  [phone] [facility]  [phone] [facility]  Mental Health   800 [phone] (emergency services [phone])  Substance Abuse Resources [facility]  [phone] [facility] [phone] [facility] [phone] [facility] [phone] Residential & Outpatient Substance Abuse Program  [phone]  Abuse/Neglect [facility] Child Abuse Hotline [phone] [facility] Child Abuse Hotline ([phone] (After Hours)  Emergency Shelter [facility] [phone]  [facility] [place] [facility] [phone] [facility] [phone]  MRSA Hotline #:   331-429-1014    [facility] Resources  [facility]     [employer]                          [facility] Dept. [address] [place]                       [address]      [address]  Blondell Reveal Phone:  [phone]                                   Phone:  [phone]                 Phone:  [phone]  [facility] Mental Health Phone:  [phone]  [facility] Child Abuse Hotline [phone] (After Hours)

## 2011-04-30 NOTE — ED Provider Notes (Addendum)
8:03 AM The patient was seen and evaluated by me this morning and is in no apparent distress, is awake and alert, and is aware of the current treatment plan.  The patient makes no requests for any interventions or treatments otherwise at this time.   Felisa Bonier, MD 04/30/11 [phone]  3:23 PM The patient has been re\re evaluated by psychiatrist Dr. Debbora Presto into the patient not to represent any threat of harm to himself or others at this time, and has advised for discharge home as the patient has been psychiatrically cleared for discharge. I agree with this assessment.  Felisa Bonier, MD 04/30/11 [phone]

## 2011-04-30 NOTE — BH Assessment (Signed)
Assessment Note   Adam Riley is an 35 y.o. male. Pt presented with [employer] after someone saw him bleeding. Pt had multiple superficial lacerations to face, neck and arms which were self-inflicted with a razor. Pt also appeared to have cut on his hair as well. Pt had BAC of 300+ at time of arrival in ED. Currently, pt denies SI, HI or psychosis. Pt stated "I'm not suicidal. I got so drunk and I done something stupid." When asked about stressors, pt stated "I'm not sure". Confronted pt about referrals made to him on 04/12/11 and 04/13/11 for detox and treatment. Pt stated "I didn't go. I probably should have, but didn't." Pt very pre-occupied with getting to work, as he was fearful he would lose his job. Discussed with EDP who requested telepsych consult to be completed.  Axis I: Alcohol Abuse and Alcohol Dependence Axis II: Deferred Axis III:  Past Medical History  Diagnosis Date  . Anxiety   . Depression    Axis IV: housing problems and problems related to social environment Axis V: 41-50 serious symptoms  Past Medical History:  Past Medical History  Diagnosis Date  . Anxiety   . Depression     History reviewed. No pertinent past surgical history.  Family History: History reviewed. No pertinent family history.  Social History:  reports that he has been smoking Cigarettes.  He has a 15 pack-year smoking history. He does not have any smokeless tobacco history on file. He reports that he drinks about 75.6 ounces of alcohol per week. He reports that he does not use illicit drugs.  Allergies: No Known Allergies  Home Medications:  Medications Prior to Admission  Medication Dose Route Frequency Provider Last Rate Last Dose  . acetaminophen (TYLENOL) tablet 650 mg  650 mg Oral Q4H PRN Carolee Rota, PA      . alum & mag hydroxide-simeth (MAALOX/MYLANTA) 200-200-20 MG/5ML suspension 30 mL  30 mL Oral PRN Carolee Rota, PA      . folic acid (FOLVITE) tablet 1 mg  1 mg Oral Daily Carolee Rota, PA   1 mg at 04/30/11 1134  . LORazepam (ATIVAN) injection 1 mg  1 mg Intravenous Q6H PRN Carolee Rota, PA      . LORazepam (ATIVAN) tablet 0-4 mg  0-4 mg Oral Q6H Carolee Rota, PA       Followed by  . LORazepam (ATIVAN) tablet 0-4 mg  0-4 mg Oral Q12H Carolee Rota, [place]      . LORazepam (ATIVAN) tablet 1 mg  1 mg Oral Q8H PRN Carolee Rota, PA      . multivitamins ther. w/minerals tablet 1 tablet  1 tablet Oral Daily Carolee Rota, [place]      . nicotine (NICODERM CQ - dosed in mg/24 hours) patch 21 mg  21 mg Transdermal Daily Carolee Rota, [place]      . ondansetron [facility]) tablet 4 mg  4 mg Oral Q8H PRN Carolee Rota, PA      . thiamine (VITAMIN B-1) tablet 100 mg  100 mg Oral Daily Carolee Rota, PA   100 mg at 04/30/11 1135   Or  . thiamine (B-1) injection 100 mg  100 mg Intravenous Daily Carolee Rota, [place]       No current outpatient prescriptions on file as of 04/30/2011.    OB/GYN Status:  No LMP for male patient.  General Assessment Data Assessment Number: 1  Living  Arrangements: Homeless Can pt return to current living arrangement?: Yes Admission Status: Voluntary Is patient capable of signing voluntary admission?: Yes Transfer from: Acute Hospital Referral Source: Self/Family/Friend  Risk to self Suicidal Ideation: No-Not Currently/Within Last 6 Months (Denies at the present time) Suicidal Intent: No-Not Currently/Within Last 6 Months (Denies at the present time) Is patient at risk for suicide?: Yes (pt presented with minor laccerations to face, neck and arms) Suicidal Plan?: No-Not Currently/Within Last 6 Months Access to Means: Yes Specify Access to Suicidal Means: knives, razors, sharps, etc What has been your use of drugs/alcohol within the last 12 months?: drank 1 case of beer last night - drinks heavily up to a case daily for years Other Self Harm Risks: "I do stupid things when I get too drunk" Triggers for Past Attempts:  Unpredictable Intentional Self Injurious Behavior: None Factors that decrease suicide risk: Absense of psychosis Family Suicide History: No Recent stressful life event(s): Financial Problems;Other (Comment) (Housing issues) Persecutory voices/beliefs?: No Depression: No Substance abuse history and/or treatment for substance abuse?: Yes Suicide prevention information given to non-admitted patients: Yes  Risk to Others Homicidal Ideation: No Thoughts of Harm to Others: No Current Homicidal Intent: No Current Homicidal Plan: No Access to Homicidal Means: No Identified Victim: n/a History of harm to others?: No Assessment of Violence: None Noted Violent Behavior Description: N/A Does patient have access to weapons?: No Criminal Charges Pending?: No Does patient have a court date: No  Mental Status Report Appear/Hygiene: Disheveled;Other (Comment) (multiple superficial laccerations; irregular haircut) Eye Contact: Good Motor Activity: Freedom of movement;Unremarkable Speech: Logical/coherent Level of Consciousness: Alert Mood: Anxious Affect: Preoccupied (Focused on getting to work/missing work) Anxiety Level: Minimal Thought Processes: Coherent;Relevant Judgement: Unimpaired Orientation: Person;Place;Time;Situation Obsessive Compulsive Thoughts/Behaviors: None  Cognitive Functioning Concentration: Normal Memory: Recent Intact;Remote Intact IQ: Average Insight: Fair Impulse Control: Fair (poor when drinking heavily) Appetite: Fair Weight Loss: 0  Weight Gain: 0  Sleep: No Change Total Hours of Sleep: 8  Vegetative Symptoms: None  Prior Inpatient/Outpatient Therapy Prior Therapy: Inpatient Prior Therapy Dates: 2009-2012 Prior Therapy Facilty/Provider(s): [facility] Reason for Treatment: DETOX  ADL Screening (condition at time of admission) Patient's cognitive ability adequate to safely complete daily activities?: Yes Patient able to express need for assistance with  ADLs?: Yes Independently performs ADLs?: Yes Weakness of Legs: None Weakness of Arms/Hands: None  Home Assistive Devices/Equipment Home Assistive Devices/Equipment: None    Abuse/Neglect Assessment (Assessment to be complete while patient is alone) Physical Abuse: Denies Verbal Abuse: Denies Sexual Abuse: Denies Exploitation of patient/patient's resources: Denies Self-Neglect: Denies Values / Beliefs Cultural Requests During Hospitalization: None Spiritual Requests During Hospitalization: None   Advance Directives (For Healthcare) Advance Directive: Patient does not have advance directive;Patient would not like information Pre-existing out of facility DNR order (yellow form or pink MOST form): No    Additional Information 1:1 In Past 12 Months?: No CIRT Risk: No Elopement Risk: No Does patient have medical clearance?: Yes     Disposition:  Disposition Disposition of Patient: Other dispositions Setting up telepsych for consult.  On Site Evaluation by:   Reviewed with Physician:     Romeo Apple 04/30/2011 12:07 PM

## 2011-04-30 NOTE — ED Notes (Signed)
Assumed care of pt

## 2011-05-03 ENCOUNTER — Emergency Department (HOSPITAL_COMMUNITY)
Admission: EM | Admit: 2011-05-03 | Discharge: 2011-05-04 | Disposition: A | Discharge status: Other Acute Inpatient Hospital | Payer: Self-pay | Attending: Emergency Medicine | Admitting: Emergency Medicine

## 2011-05-03 ENCOUNTER — Encounter (HOSPITAL_COMMUNITY): Payer: Self-pay

## 2011-05-03 DIAGNOSIS — F3289 Other specified depressive episodes: Secondary | ICD-10-CM | POA: Insufficient documentation

## 2011-05-03 DIAGNOSIS — F329 Major depressive disorder, single episode, unspecified: Secondary | ICD-10-CM | POA: Insufficient documentation

## 2011-05-03 DIAGNOSIS — F17200 Nicotine dependence, unspecified, uncomplicated: Secondary | ICD-10-CM | POA: Insufficient documentation

## 2011-05-03 DIAGNOSIS — R45851 Suicidal ideations: Secondary | ICD-10-CM | POA: Insufficient documentation

## 2011-05-03 LAB — CBC
HCT: 42.4 % (ref 39.0–52.0)
Hemoglobin: 14.9 g/dL (ref 13.0–17.0)
MCV: 96.1 fL (ref 78.0–100.0)
RDW: 12.6 % (ref 11.5–15.5)
WBC: 9.5 10*3/uL (ref 4.0–10.5)

## 2011-05-03 LAB — ETHANOL: Alcohol, Ethyl (B): 254 mg/dL — ABNORMAL HIGH (ref 0–11)

## 2011-05-03 MED ORDER — THERA M PLUS PO TABS
1.0000 | ORAL_TABLET | Freq: Every day | ORAL | Status: DC
  Administered 2011-05-04: 1 via ORAL
  Filled 2011-05-03: qty 1

## 2011-05-03 MED ORDER — FOLIC ACID 1 MG PO TABS
1.0000 mg | ORAL_TABLET | Freq: Every day | ORAL | Status: DC
  Administered 2011-05-04: 1 mg via ORAL
  Filled 2011-05-03: qty 1

## 2011-05-03 MED ORDER — LORAZEPAM 1 MG PO TABS
0.0000 mg | ORAL_TABLET | Freq: Four times a day (QID) | ORAL | Status: DC
  Administered 2011-05-04: 1 mg via ORAL

## 2011-05-03 MED ORDER — NICOTINE 21 MG/24HR TD PT24
21.0000 mg | MEDICATED_PATCH | Freq: Every day | TRANSDERMAL | Status: DC
  Administered 2011-05-04: 21 mg via TRANSDERMAL
  Filled 2011-05-03: qty 1

## 2011-05-03 MED ORDER — LORAZEPAM 1 MG PO TABS
1.0000 mg | ORAL_TABLET | Freq: Four times a day (QID) | ORAL | Status: DC | PRN
  Administered 2011-05-04: 1 mg via ORAL
  Filled 2011-05-03: qty 1

## 2011-05-03 MED ORDER — VITAMIN B-1 100 MG PO TABS
100.0000 mg | ORAL_TABLET | Freq: Every day | ORAL | Status: DC
  Administered 2011-05-04: 100 mg via ORAL
  Filled 2011-05-03: qty 1

## 2011-05-03 MED ORDER — LORAZEPAM 2 MG/ML IJ SOLN: 1.0000 mg | Freq: Four times a day (QID) | INTRAMUSCULAR | Status: DC | PRN

## 2011-05-03 MED ORDER — ONDANSETRON HCL 4 MG PO TABS: 4.0000 mg | ORAL_TABLET | Freq: Three times a day (TID) | ORAL | Status: DC | PRN

## 2011-05-03 MED ORDER — THIAMINE HCL 100 MG/ML IJ SOLN: 100.0000 mg | Freq: Every day | INTRAMUSCULAR | Status: DC

## 2011-05-03 MED ORDER — ZOLPIDEM TARTRATE 5 MG PO TABS: 5.0000 mg | ORAL_TABLET | Freq: Every evening | ORAL | Status: DC | PRN

## 2011-05-03 MED ORDER — IBUPROFEN 600 MG PO TABS: 600.0000 mg | ORAL_TABLET | Freq: Three times a day (TID) | ORAL | Status: DC | PRN

## 2011-05-03 MED ORDER — LORAZEPAM 1 MG PO TABS: 0.0000 mg | ORAL_TABLET | Freq: Two times a day (BID) | ORAL | Status: DC

## 2011-05-03 NOTE — ED Notes (Signed)
LOV:FIEP3<IR> Expected date:05/03/11<BR> Expected time: 9:57 PM<BR> Means of arrival:Police<BR> Comments:<BR>

## 2011-05-03 NOTE — ED Notes (Signed)
Pt was picked up by the police at the [place] saying that he was suicidal, pt has had several beers and [employer] states that he's usually drunk when they deal with him, pt cooperative at this time

## 2011-05-04 ENCOUNTER — Inpatient Hospital Stay (HOSPITAL_COMMUNITY)
Admission: AD | Admit: 2011-05-04 | Discharge: 2011-05-05 | DRG: 897 | Disposition: A | Discharge status: Home / Self Care | Payer: PRIVATE HEALTH INSURANCE | Source: Ambulatory Visit | Attending: Psychiatry | Admitting: Psychiatry

## 2011-05-04 DIAGNOSIS — F609 Personality disorder, unspecified: Secondary | ICD-10-CM

## 2011-05-04 DIAGNOSIS — F10988 Alcohol use, unspecified with other alcohol-induced disorder: Secondary | ICD-10-CM

## 2011-05-04 DIAGNOSIS — F1994 Other psychoactive substance use, unspecified with psychoactive substance-induced mood disorder: Secondary | ICD-10-CM

## 2011-05-04 DIAGNOSIS — F10229 Alcohol dependence with intoxication, unspecified: Secondary | ICD-10-CM | POA: Diagnosis present

## 2011-05-04 DIAGNOSIS — R6250 Unspecified lack of expected normal physiological development in childhood: Secondary | ICD-10-CM

## 2011-05-04 DIAGNOSIS — F1010 Alcohol abuse, uncomplicated: Principal | ICD-10-CM

## 2011-05-04 DIAGNOSIS — F39 Unspecified mood [affective] disorder: Secondary | ICD-10-CM

## 2011-05-04 DIAGNOSIS — G4700 Insomnia, unspecified: Secondary | ICD-10-CM

## 2011-05-04 DIAGNOSIS — R45851 Suicidal ideations: Secondary | ICD-10-CM

## 2011-05-04 DIAGNOSIS — F411 Generalized anxiety disorder: Secondary | ICD-10-CM

## 2011-05-04 LAB — COMPREHENSIVE METABOLIC PANEL
Albumin: 3.7 g/dL (ref 3.5–5.2)
Alkaline Phosphatase: 84 U/L (ref 39–117)
BUN: 12 mg/dL (ref 6–23)
Chloride: 99 mEq/L (ref 96–112)
Creatinine, Ser: 0.6 mg/dL (ref 0.50–1.35)
GFR calc Af Amer: >90 mL/min (ref >90)
GFR calc non Af Amer: >90 mL/min (ref >90)
Glucose, Bld: 98 mg/dL (ref 70–99)
Total Bilirubin: 0.2 mg/dL — ABNORMAL LOW (ref 0.3–1.2)

## 2011-05-04 LAB — RAPID URINE DRUG SCREEN, HOSP PERFORMED
Amphetamines: NOT DETECTED
Opiates: NOT DETECTED

## 2011-05-04 MED ORDER — ONDANSETRON 4 MG PO TBDP: 4.0000 mg | ORAL_TABLET | Freq: Four times a day (QID) | ORAL | Status: DC | PRN

## 2011-05-04 MED ORDER — CHLORDIAZEPOXIDE HCL 25 MG PO CAPS
25.0000 mg | ORAL_CAPSULE | Freq: Four times a day (QID) | ORAL | Status: DC | PRN
  Administered 2011-05-04: 25 mg via ORAL
  Filled 2011-05-04: qty 1

## 2011-05-04 MED ORDER — CHLORDIAZEPOXIDE HCL 25 MG PO CAPS
25.0000 mg | ORAL_CAPSULE | Freq: Four times a day (QID) | ORAL | Status: DC | PRN
  Administered 2011-05-05: 25 mg via ORAL
  Filled 2011-05-04: qty 1

## 2011-05-04 MED ORDER — IBUPROFEN 600 MG PO TABS: 600.0000 mg | ORAL_TABLET | Freq: Three times a day (TID) | ORAL | Status: DC | PRN

## 2011-05-04 MED ORDER — ALUM & MAG HYDROXIDE-SIMETH 200-200-20 MG/5ML PO SUSP: 30.0000 mL | ORAL | Status: DC | PRN

## 2011-05-04 MED ORDER — THERA M PLUS PO TABS
1.0000 | ORAL_TABLET | Freq: Every day | ORAL | Status: DC
  Administered 2011-05-04: 1 via ORAL

## 2011-05-04 MED ORDER — THERA M PLUS PO TABS
1.0000 | ORAL_TABLET | Freq: Every day | ORAL | Status: DC
  Administered 2011-05-04: 22:00:00 via ORAL
  Administered 2011-05-05: 1 via ORAL
  Filled 2011-05-04 (×2): qty 1

## 2011-05-04 MED ORDER — LOPERAMIDE HCL 2 MG PO CAPS: 2.0000 mg | ORAL_CAPSULE | ORAL | Status: DC | PRN

## 2011-05-04 MED ORDER — HYDROXYZINE HCL 25 MG PO TABS
25.0000 mg | ORAL_TABLET | Freq: Four times a day (QID) | ORAL | Status: DC | PRN
  Administered 2011-05-04: 25 mg via ORAL

## 2011-05-04 MED ORDER — HYDROXYZINE PAMOATE 25 MG PO CAPS: 25.0000 mg | ORAL_CAPSULE | Freq: Four times a day (QID) | ORAL | Status: DC | PRN

## 2011-05-04 MED ORDER — VITAMIN B-1 100 MG PO TABS: 100.0000 mg | ORAL_TABLET | Freq: Every day | ORAL | Status: DC

## 2011-05-04 MED ORDER — NICOTINE 21 MG/24HR TD PT24
21.0000 mg | MEDICATED_PATCH | Freq: Every day | TRANSDERMAL | Status: DC
  Administered 2011-05-05: 21 mg via TRANSDERMAL
  Filled 2011-05-04 (×3): qty 1

## 2011-05-04 MED ORDER — MAGNESIUM HYDROXIDE 400 MG/5ML PO SUSP: 30.0000 mL | Freq: Every day | ORAL | Status: DC | PRN

## 2011-05-04 MED ORDER — THERA M PLUS PO TABS: 1.0000 | ORAL_TABLET | Freq: Every day | ORAL | Status: DC

## 2011-05-04 MED ORDER — VITAMIN B-1 100 MG PO TABS
100.0000 mg | ORAL_TABLET | Freq: Every day | ORAL | Status: DC
  Filled 2011-05-04: qty 1

## 2011-05-04 MED ORDER — FOLIC ACID 1 MG PO TABS
1.0000 mg | ORAL_TABLET | Freq: Every day | ORAL | Status: DC
  Administered 2011-05-05: 1 mg via ORAL
  Filled 2011-05-04 (×3): qty 1

## 2011-05-04 MED ORDER — VITAMIN B-1 100 MG PO TABS
100.0000 mg | ORAL_TABLET | Freq: Every day | ORAL | Status: DC
  Administered 2011-05-05: 100 mg via ORAL
  Filled 2011-05-04 (×3): qty 1

## 2011-05-04 MED ORDER — THIAMINE HCL 100 MG/ML IJ SOLN: 100.0000 mg | Freq: Once | INTRAMUSCULAR | Status: DC

## 2011-05-04 MED ORDER — ACETAMINOPHEN 325 MG PO TABS: 650.0000 mg | ORAL_TABLET | Freq: Four times a day (QID) | ORAL | Status: DC | PRN

## 2011-05-04 MED ORDER — TRAZODONE HCL 50 MG PO TABS
50.0000 mg | ORAL_TABLET | Freq: Every evening | ORAL | Status: DC | PRN
  Administered 2011-05-04: 50 mg via ORAL
  Filled 2011-05-04: qty 1

## 2011-05-04 MED ORDER — CHLORDIAZEPOXIDE HCL 25 MG PO CAPS: 25.0000 mg | ORAL_CAPSULE | Freq: Four times a day (QID) | ORAL | Status: DC | PRN

## 2011-05-04 NOTE — Progress Notes (Signed)
Patient ID: Adam Riley, male   DOB: 09-Nov-1975, 35 y.o.   MRN: [id number] This is a 35yr old Caucasian male admitted for substance abuse. Patient reported history of drinking alcohol (beer) since he was 35 yrs old. He reported  drinking 12pack of beer three days a week. He also reported history of audio -visual hallucinations. " I get aggravated when I hear voices and see stuff and I drink or cut my self". Patient has superficial cuts on both fore arms. He reported falling yesterday and had fell couple of time within the past 6 months. He is a high fall risk due to that. Patient appeared very cooperative, endorsed on and off suicide thoughts but verbally contracted for safety. Pt oriented to the unit and Q15 min checks initiated.

## 2011-05-04 NOTE — ED Provider Notes (Addendum)
Act eval,placement pending. Pt alert, content. Vitals stable. Discussed w team, will notify us of plan.   Adam Roots, MD 05/04/11 [phone]  Pt accepted [facility], dr Rogers Blocker, bed ready   Adam Roots, MD 05/04/11 [phone]

## 2011-05-04 NOTE — H&P (Signed)
Psychiatric Admission Assessment Adult  Patient Identification:  Adam Riley Date of Evaluation:  05/04/2011 Chief Complaint:  MOOD D/O; ETOH ABUSE Alcohol 254 AST 218 ALT 471 History of Present Illness:: Presented to ED at [facility] and reporting SI with no attempt or gesture yesterday.   Adam Riley was with [place] late May early June gaining admission at that time for alcohol abuse and reporting PTSD from his service. It developed after checking for his benefit status that he had lied as he wanted to be admitted.After he was discharged he never followed up.     Today he reports that he began abusing alcohol in his 20's. He says that he hears noises in his head and initially drinking alleviated these sounds but now the drinking causes them. He has never been med compliant and has lost numerous employments. He says he gets frustrated and then gets fired. Only completed 9th grade and is not sure if he has ever been tested for LD. Reports being hospitalized for a  "chemical imbalance" age 80-13 here when it was [facility] and also at [facility]. Has been cutting on his hands recently.    Mood Symptoms:  Concentration Depression Helplessness Hopelessness Mood Swings Sadness SI Sleep Depression Symptoms:  insomnia, feelings of worthlessness/guilt, difficulty concentrating, hopelessness, impaired memory and suicidal thoughts without plan (Hypo) Manic Symptoms:   Elevated Mood:  No Irritable Mood:  Yes Grandiosity:  No Distractibility:  No Labiality of Mood:  Yes Delusions:  No Hallucinations:  Yes he reports but does not appear to be responding to internal stimuli  Impulsivity:  No Sexually Inappropriate Behavior:  No Financial Extravagance:  No Flight of Ideas:  No  Anxiety Symptoms: Excessive Worry:  No Panic Symptoms:  No Agoraphobia:  No Obsessive Compulsive: No  Symptoms: None Specific Phobias:  No Social Anxiety:  Yes  Psychotic Symptoms:  Hallucinations:   Auditory Delusions:  No Paranoia:  No   Ideas of Reference:  No  PTSD Symptoms: Ever had a traumatic exposure:  No Had a traumatic exposure in the last month:  No Re-experiencing:  None Hypervigilance:  No Hyperarousal:  None Avoidance:  None  Traumatic Brain Injury: No history   Past Psychiatric History:Several detoxes most recent June 2012  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 Anxiety   . Depression    History of Loss of Consciousness:  Yes  blackouts  Seizure History:  No Cardiac History:  No Allergies:  No Known Allergies Current Medications:  Current Facility-Administered Medications  Medication Dose Route Frequency Provider Last Rate Last Dose  . acetaminophen (TYLENOL) tablet 650 mg  650 mg Oral Q6H PRN Mechele Dawley, NP      . alum & mag hydroxide-simeth (MAALOX/MYLANTA) 200-200-20 MG/5ML suspension 30 mL  30 mL Oral Q4H PRN Mechele Dawley, NP      . chlordiazePOXIDE (LIBRIUM) capsule 25 mg  25 mg Oral Q6H PRN Mechele Dawley, NP   25 mg at 05/04/11 1730  . hydrOXYzine (VISTARIL) capsule 25 mg  25 mg Oral Q6H PRN Mechele Dawley, NP      .  loperamide (IMODIUM) capsule 2-4 mg  2-4 mg Oral PRN Mechele Dawley, NP      . magnesium hydroxide (MILK OF MAGNESIA) suspension 30 mL  30 mL Oral Daily PRN Mechele Dawley, NP      . multivitamins ther. w/minerals tablet 1 tablet  1 tablet Oral Daily Mechele Dawley, NP   1 tablet at 05/04/11 1730  . ondansetron (ZOFRAN-ODT) disintegrating tablet 4 mg  4 mg Oral Q6H PRN Mechele Dawley, NP      . thiamine (VITAMIN B-1) tablet  100 mg  100 mg Oral Daily Mechele Dawley, NP      . traZODone (DESYREL) tablet 50 mg  50 mg Oral QHS PRN Mechele Dawley, NP       Facility-Administered Medications Ordered in Other Encounters  Medication Dose Route Frequency Provider Last Rate Last Dose  . DISCONTD: folic acid (FOLVITE) tablet 1 mg  1 mg Oral Daily Lyanne Co, MD   1 mg at 05/04/11 1138  . DISCONTD: ibuprofen (ADVIL,MOTRIN) tablet 600 mg  600 mg Oral Q8H PRN Lyanne Co, MD      . DISCONTD: LORazepam (ATIVAN) injection 1 mg  1 mg Intravenous Q6H PRN Lyanne Co, MD      . DISCONTD: LORazepam (ATIVAN) tablet 0-4 mg  0-4 mg Oral Q6H Lyanne Co, MD   1 mg at 05/04/11 1224  . DISCONTD: LORazepam (ATIVAN) tablet 0-4 mg  0-4 mg Oral Q12H Lyanne Co, MD      . DISCONTD: LORazepam (ATIVAN) tablet 1 mg  1 mg Oral Q6H PRN Lyanne Co, MD   1 mg at 05/04/11 1221  . DISCONTD: multivitamins ther. w/minerals tablet 1 tablet  1 tablet Oral Daily Lyanne Co, MD   1 tablet at 05/04/11 1145  . DISCONTD: nicotine (NICODERM CQ - dosed in mg/24 hours) patch 21 mg  21 mg Transdermal Daily Lyanne Co, MD   21 mg at 05/04/11 1154  . DISCONTD: ondansetron (ZOFRAN) tablet 4 mg  4 mg Oral Q8H PRN Lyanne Co, MD      . DISCONTD: thiamine (B-1) injection 100 mg  100 mg Intravenous Daily Lyanne Co, MD      . DISCONTD: thiamine (VITAMIN B-1) tablet 100 mg  100 mg Oral Daily Lyanne Co, MD   100 mg at 05/04/11 1156  . DISCONTD: zolpidem (AMBIEN) tablet 5 mg  5 mg Oral QHS PRN Lyanne Co, MD        Previous Psychotropic Medications:  Medication Dose                        Substance Abuse History in the last 12 months: Substance Age of 1st Use Last Use Amount Specific Type  Nicotine      Alcohol 19  Yesterday  Case of beer    Cannabis      Opiates      Cocaine      Methamphetamines      LSD      Ecstasy      Benzodiazepines      Caffeine      Inhalants      Others:                          Medical Consequences of Substance Abuse:  Legal Consequences of Substance Abuse:  Family Consequences of Substance Abuse:  Blackouts:  Yes DT's:  No Withdrawal Symptoms:  Headaches Nausea Tremors  Social History: Current Place of Residence:   Place of Birth:   Family Members: Marital Status:  Divorced X1 wife does not allow contact with the children  Children:  Sons:12 & 8   Daughters: 10  Relationships: Father is in [place] hopes to be able to stay with him post discharge  Education:  9th poor  Educational Problems/Performance: Religious Beliefs/Practices: History of Abuse (Emotional/Phsycial/Sexual) None reported  Occupational Experiences: numerous and short lived can't keep Probation officer History:  None  Legal History: Doesn't have issues at this time - will need to verify Hobbies/Interests:  Family History:  No family history on file. Is not aware of any family members with Bipolar.   Mental Status Examination/Evaluation: Objective:  Appearance: Disheveled  Eye Contact::  Good  Speech:  Clear and Coherent  Volume:  Normal  Mood:  Anxiously depressed   Affect:  Congruent  Thought Process:  Linear  Orientation:  Full  Thought Content:  Hallucinations: Auditory he reports whispering sounds   Suicidal Thoughts:  No not at this time   Homicidal Thoughts:  No  Judgement:  Impaired  Insight:  Shallow  Psychomotor Activity:  Normal  Akathisia:  No  Handed:  Right  AIMS (if indicated):     Assets:  Communication Skills Desire for Improvement Resilience Others:  father is nearby     Laboratory/X-Ray Psychological Evaluation(s)      Assessment:    AXIS I Substance Abuse-alcohol  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 Anxiety   . Depression      AXIS IV economic problems, occupational problems, problems related to social environment and problems with primary  support group  AXIS V 41-50 serious symptoms   Treatment Plan/Recommendations:Detox using Low Dose Librium Protocol   Treatment Plan Summary: Daily contact with patient to assess and evaluate symptoms and progress in treatment Medication management  Observation Level/Precautions:  C.O. 15 min checks   Laboratory:  No more labs needed   Psychotherapy: Group and AA   Medications: assess need   Routine PRN Medications:  Yes  Consultations:    Discharge Concerns: has asked to go to a longer program   Other:  Have case manager get records from [facility] - see if he has LD    I have reviewed Ed records and examined the patient myself.  Specifically he has no tremor.Nor active withdrawal at this time.   Dior Dominik,MICKIE D. 11/27/20126:49 PM
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
## 2011-05-04 NOTE — ED Notes (Signed)
Report called to Lupita Leash at [facility], pt. Going to rm [id number].

## 2011-05-04 NOTE — ED Provider Notes (Signed)
History     CSN: [id number] Arrival date & time: 05/03/2011 10:09 PM   First MD Initiated Contact with Patient 05/03/11 2307      Chief Complaint  Patient presents with  . Suicidal    (Consider location/radiation/quality/duration/timing/severity/associated sxs/prior treatment) The history is provided by the patient.   patient reports he is suicidal.  He has not made any attempt on his lites today.  He reports a long-standing history of alcohol abuse and is gastric earlier today.  He has no homicidal ideation.  He denies delusions hallucinations.  Nothing worsens the symptoms.  Nothing improves his symptoms.  His symptoms are moderate in severity.  Past Medical History  Diagnosis Date  . Anxiety   . Depression     History reviewed. No pertinent past surgical history.  History reviewed. No pertinent family history.  History  Substance Use Topics  . Smoking status: Current Everyday Smoker -- 1.0 packs/day for 15 years    Types: Cigarettes  . Smokeless tobacco: Not on file  . Alcohol Use: 75.6 oz/week    126 Cans of beer per week      Review of Systems  All other systems reviewed and are negative.    Allergies  Review of patient's allergies indicates no known allergies.  Home Medications  No current outpatient prescriptions on file.  BP 142/85  Pulse 119  Temp(Src) 98.8 F (37.1 C) (Oral)  Resp 22  SpO2 96%  Physical Exam  Constitutional: He is oriented to person, place, and time. He appears well-developed and well-nourished.  HENT:  Head: Normocephalic.  Eyes: EOM are normal.  Neck: Normal range of motion.  Pulmonary/Chest: Effort normal.  Musculoskeletal: Normal range of motion.  Neurological: He is alert and oriented to person, place, and time.  Psychiatric:       Smells of EtOH.  Intoxicated appearing.  Suicidal ideation    ED Course  Procedures (including critical care time)  Labs Reviewed  COMPREHENSIVE METABOLIC PANEL - Abnormal; Notable  for the following:    AST 218 (*) NO VISIBLE HEMOLYSIS   ALT 471 (*)    Total Bilirubin 0.2 (*)    All other components within normal limits  ETHANOL - Abnormal; Notable for the following:    Alcohol, Ethyl (B) 254 (*)    All other components within normal limits  CBC  URINE RAPID DRUG SCREEN (HOSP PERFORMED)   No results found.   No diagnosis found.    MDM  Will involve ACT team for suicidal ideation       Lyanne Co, MD 05/04/11 0120

## 2011-05-05 DIAGNOSIS — F39 Unspecified mood [affective] disorder: Secondary | ICD-10-CM

## 2011-05-05 NOTE — Progress Notes (Signed)
Patient ID: Adam Riley, male   DOB: [birth year], 35 y.o.   MRN: [id number] Writer reviewed pt discharge instructions with pt including emergency resources available after d/c. Pt verbalized understanding of all instructions. Pt denies any SI/HI and A/V hallucinations. 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 Pt affect is bright and mood is gracious. Pt also reiterated that he was lying about having hallucinations in the first place. All belongings in locker at time of d/c given to pt. Pt states that he has no reservations about leaving and pt does not appear to be in any distress.

## 2011-05-05 NOTE — Discharge Summary (Signed)
Discharge Note  Patient:  Adam Riley is an 35 y.o., male DOB:  1975/07/14  Date of Admission:  05/04/2011  Date of Discharge:  05/05/2011  Level of Care:  OP  Discharge destination:  Home  Is patient on multiple antipsychotic therapies at discharge:  No    Patient phone:  [phone] (home)  Patient address:   [address] [place] [address],   The patient received suicide prevention pamphlet:  Yes Belongings returned:  Judene Companion, Siearra Amberg 05/05/2011, 5:35 PM

## 2011-05-05 NOTE — Progress Notes (Signed)
Pt. Was in bed when writer attempted first assessment.  Pt. Had received librium which he reports helped him with his anxiety and withdrawals symptoms.  Pt. Was resting quietly.  Pt. Admitted that he hears command voices but has not been receiving medication for this.  States this is when he cuts.  Writer encouraged pt. To discuss this with his MD in the am.  Writer ask pt. How long and he states he has been hearing them since he was young.  Pt. Admits he was on medication years ago but due to insurance etc. Quit taking them.  Pt. Had remained calm and cooperative this pm.  He received HS medications and has been resting quietly.  Pt. Was encouraged to let the techs know if he is awake when they do their rounds so RN can be aware.

## 2011-05-05 NOTE — Progress Notes (Addendum)
Patient ID: Adam Riley, male   DOB: Apr 07, 1976, 35 y.o.   MRN: [id number]  Pt is awake and active on the unit this AM. Pt affect is sad/anxious and mood is depressed. Pt is participating in groups and attending meals. Pt endorses auditory and visual hallucinations in the form of voices calling his name and seeing ghost like shapes occasionally in his environment. Pt also has passive thoughts of self harm but not today and the pt does contract for safety. Writer will continue to monitor pt status.   Pt approached writer at 1400 hrs today stating that the voices/ auditory hallucinations were increasing and requested medication for it. Pt stated that as a result he was feeling more suicidal and wished to speak with the provider about new medications that he might receive. Writer administered prn medication and communicated with provider about the pt's status.  At 1520 hrs MHT reported to me that the pt had stated that he had been lying about hearing voices and seeing things that were not there so that he could stay here longer because he believed he had lost his job and had no where to go. Pt just had a telephone conversation with his boss who wants him back at work tonight, so the pt is hoping to leave now. Writer discussed pt circumstances with provider and will continue to monitor.

## 2011-05-05 NOTE — Progress Notes (Signed)
[facility] Group Notes:  (Counselor/Nursing/MHT/Case Management/Adjunct)  05/05/2011 5:04 PM  Type of Therapy:  Group Therapy at 11:00AM and 1:15 PM  Participation Level:  Did Not Attend either group  Clide Dales 05/05/2011, 5:04 PM

## 2011-05-05 NOTE — Progress Notes (Signed)
Pt attended discharge planning group and actively participated.  Pt presents with flat affect and depressed mood.  Pt was open with sharing reason for entering the hospital.  Pt states he was suicidal and depressed, and had a plan to kill himself.  Pt states he then drinks to cope with the depression.  Pt states depression is something he has been dealing with all his life.  Pt states he also hears voices and sees things that aren't there.  Pt states he isn't at this moment, and last heard whispers and voices last night.  Pt states this is the most bothersome symptom and wants this resolved before anything.  Pt open to treatment.  SW will explore possible referral options for pt.  Pt ranks depression at a 10 today and anxiety at a 5.  Pt reports episodic SI and contracts for safety.  Pt states he does not have a residence in [place].  Pt states that he plans to stay with his father in [place] upon d/c, but reports this is not a supportive environment.    Adam Riley, LCSWA 05/05/2011  9:48 AM

## 2011-05-05 NOTE — Progress Notes (Signed)
SW met with pt and RN at this time.  Pt explained that he called his job and was informed by his boss that he did not lose his job.  Pt states that it was the boss' son that told him he was fired.  Pt states that his boss asked where he was and said they were short staffed and work and needed him there at 7 pm tonight.  Pt states that he lied about hearing voices and seeing things to get into the hospital and stay longer.  Pt states that he thought he lost his job and housing, which caused him to be depressed and drink.  Pt states that now that he knows he has both, he is feeling much better.  SW asked pt if he was commuting from [facility] where he originally said he was staying.  Pt states that he is renting a room in [place] and called to confirm he still has this housing and was told he still does.  Pt requesting to d/c immediately.  SW concerned with pt lying about symptoms and recent self injurious behaviors.  If decided pt can d/c today, pt will f/u at [facility] for medication management and therapy and AA meetings; provided pt with AA meeting brochure for local meeting times and locations.    Reyes Ivan, LCSWA 05/05/2011  3:22 PM
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
## 2011-05-06 NOTE — Progress Notes (Signed)
[facility] Adult Inpatient Family/Significant Other Suicide Prevention Education  Suicide Prevention Education:  Contact Attempts: Father, Arin Peral, [phone], has been identified by the patient as the family member/significant other with whom the patient will be residing, and identified as the person who will aid the patient in the event of a mental health crisis.  With written consent from the patient, an attempt was made to provide suicide prevention education, following the patient's discharge.  I was unsuccessful in providing suicide prevention education to the father at this time, and will call again although the patient was discharged by physician on 05/05/11.  A suicide education pamphlet was given to the patient to share with family/significant other by nurse at discharge. Writer had inserted mobile crisis management business card w contact information into chart for pt at discharge.  Date and time of first attempt:05/06/11 at 10:01am Date and time of second attempt: to be made by writer  Dyane Dustman, Julious Payer 05/06/2011, 9:56 AM

## 2011-05-06 NOTE — Progress Notes (Signed)
Patient Discharge Instructions:  Discharge Note Faxed,   05/06/2011 After Visit Summary Faxed,  05/06/2011 Faxed to the [facility] Care provider:  05/06/2011 Facesheet faxed 05/06/2011  Faxed to [facility] @ [phone]  Heloise Purpura, Eduard Clos, 05/06/2011, 1:22 PM

## 2011-07-13 ENCOUNTER — Emergency Department (HOSPITAL_COMMUNITY): Payer: Self-pay

## 2011-07-13 ENCOUNTER — Emergency Department (HOSPITAL_COMMUNITY)
Admission: EM | Admit: 2011-07-13 | Discharge: 2011-07-13 | Disposition: A | Discharge status: Home / Self Care | Payer: Self-pay | Attending: Emergency Medicine | Admitting: Emergency Medicine

## 2011-07-13 DIAGNOSIS — S61419A Laceration without foreign body of unspecified hand, initial encounter: Secondary | ICD-10-CM

## 2011-07-13 DIAGNOSIS — S61409A Unspecified open wound of unspecified hand, initial encounter: Secondary | ICD-10-CM | POA: Insufficient documentation

## 2011-07-13 DIAGNOSIS — F17200 Nicotine dependence, unspecified, uncomplicated: Secondary | ICD-10-CM | POA: Insufficient documentation

## 2011-07-13 DIAGNOSIS — W19XXXA Unspecified fall, initial encounter: Secondary | ICD-10-CM | POA: Insufficient documentation

## 2011-07-13 MED ORDER — BACITRACIN ZINC 500 UNIT/GM EX OINT
1.0000 "application " | TOPICAL_OINTMENT | Freq: Two times a day (BID) | CUTANEOUS | Status: DC
  Administered 2011-07-13: 1 via TOPICAL

## 2011-07-13 MED ORDER — TETANUS-DIPHTH-ACELL PERTUSSIS 5-2.5-18.5 LF-MCG/0.5 IM SUSP
0.5000 mL | Freq: Once | INTRAMUSCULAR | Status: AC
  Administered 2011-07-13: 0.5 mL via INTRAMUSCULAR
  Filled 2011-07-13: qty 0.5

## 2011-07-13 MED ORDER — HYDROCODONE-ACETAMINOPHEN 5-325 MG PO TABS: 1.0000 | ORAL_TABLET | ORAL | Status: AC | PRN

## 2011-07-13 NOTE — ED Notes (Signed)
Patient transported to X-ray 

## 2011-07-13 NOTE — ED Notes (Signed)
Suture card outside room.

## 2011-07-13 NOTE — ED Notes (Signed)
Pt hand started bleeding. Site controlled and wrapped. RN aware.
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
## 2011-07-13 NOTE — ED Provider Notes (Signed)
History     CSN: [id number]  Arrival date & time 07/13/11  0202   First MD Initiated Contact with Patient 07/13/11 0406      Chief Complaint  Patient presents with  . Extremity Laceration     Patient is a 36 y.o. male presenting with hand injury. The history is provided by the patient.  Hand Injury  The incident occurred 3 to 5 hours ago. The incident occurred at home. The injury mechanism was a fall. The pain is present in the right hand. The quality of the pain is described as aching. The pain is at a severity of 7/10. The pain is moderate. The pain has been constant since the incident. Pertinent negatives include no fever. He reports no foreign bodies present. The symptoms are aggravated by movement. He has tried nothing for the symptoms.  Patient reports falling at home while shaving injuring the right hand. Admits he was drinking, a lot and does not remember exact mechanism of the fall. Denies loss of consciousness head injury or other complaints. C/o lacerations to (R) hand.  Past Medical History  Diagnosis Date  . Anxiety   . Depression     No past surgical history on file.  No family history on file.  History  Substance Use Topics  . Smoking status: Current Everyday Smoker -- 1.0 packs/day for 15 years    Types: Cigarettes  . Smokeless tobacco: Not on file  . Alcohol Use: 75.6 oz/week    126 Cans of beer per week      Review of Systems  Constitutional: Negative.  Negative for fever.  HENT: Negative.   Eyes: Negative.   Respiratory: Negative.   Cardiovascular: Negative.   Gastrointestinal: Negative.   Genitourinary: Negative.   Musculoskeletal: Negative.   Skin: Negative.   Neurological: Negative.   Hematological: Negative.   Psychiatric/Behavioral: Negative.     Allergies  Review of patient's allergies indicates no known allergies.  Home Medications  No current outpatient prescriptions on file.  There were no vitals taken for this visit.  Physical  Exam  Constitutional: He is oriented to person, place, and time. He appears well-developed and well-nourished.  HENT:  Head: Normocephalic and atraumatic.  Eyes: Conjunctivae are normal.  Neck: Neck supple.  Cardiovascular: Normal rate.   Pulmonary/Chest: Effort normal.  Musculoskeletal: Normal range of motion.       Multiple (predominately superficial)  linear lacerations to (R) hand at the base of the posterior thumb and proximal hand that vary in length and depth. Some of the lacerations appear to criss-cross. Pt able to flex and extend all fingers and thumb. Sensation intact.  Neurological: He is alert and oriented to person, place, and time.  Skin: Skin is warm and dry.  Psychiatric: He has a normal mood and affect.    ED Course  Procedures (including critical care time)  Labs Reviewed - No data to display Dg Wrist Complete Right  07/13/2011  *RADIOLOGY REPORT*  Clinical Data: Wrist laceration  RIGHT WRIST - COMPLETE 3+ VIEW  Comparison: None.  Findings: No acute osseous abnormality.  No fracture or dislocation.  No aggressive osseous lesion.  No radiopaque foreign body.  IMPRESSION: No acute osseous abnormality.  Original Report Authenticated By: Waneta Martins, M.D.   Dg Hand Complete Right  07/13/2011  *RADIOLOGY REPORT*  Clinical Data: Wrist laceration.  RIGHT HAND - COMPLETE 3+ VIEW  Comparison: None.  Findings: No acute fracture or dislocation.  No radiopaque foreign body.  No  aggressive osseous lesion.  IMPRESSION: No acute osseous abnormality.  Original Report Authenticated By: Waneta Martins, M.D.     No diagnosis found.    MDM  HPI/PE and clinical findings c/w  1. Fall 2. Multiple small predominately superficial lacs to dorsal (R) hand at base of thumb (wound closure w/ wound edges well approximated)     (r) hand/wrist xrays w/o acute findings. Motor and sensation intact. T-Dap updated        Leanne Chang, NP 07/15/11 [phone]

## 2011-07-16 NOTE — ED Provider Notes (Signed)
Medical screening examination/treatment/procedure(s) were performed by non-physician practitioner and as supervising physician I was immediately available for consultation/collaboration.  Zyia Kaneko, MD 07/16/11 1058 

## 2018-01-11 ENCOUNTER — Other Ambulatory Visit: Payer: Self-pay

## 2018-01-11 ENCOUNTER — Encounter (HOSPITAL_COMMUNITY): Payer: Self-pay

## 2018-01-11 ENCOUNTER — Emergency Department (HOSPITAL_COMMUNITY)
Admission: EM | Admit: 2018-01-11 | Discharge: 2018-01-12 | Disposition: A | Discharge status: Other Acute Inpatient Hospital | Payer: Self-pay | Attending: Emergency Medicine | Admitting: Emergency Medicine

## 2018-01-11 DIAGNOSIS — R45851 Suicidal ideations: Secondary | ICD-10-CM | POA: Insufficient documentation

## 2018-01-11 DIAGNOSIS — Z008 Encounter for other general examination: Secondary | ICD-10-CM

## 2018-01-11 DIAGNOSIS — F1910 Other psychoactive substance abuse, uncomplicated: Secondary | ICD-10-CM | POA: Insufficient documentation

## 2018-01-11 DIAGNOSIS — R440 Auditory hallucinations: Secondary | ICD-10-CM | POA: Insufficient documentation

## 2018-01-11 DIAGNOSIS — Z720 Tobacco use: Secondary | ICD-10-CM

## 2018-01-11 DIAGNOSIS — Z046 Encounter for general psychiatric examination, requested by authority: Secondary | ICD-10-CM | POA: Insufficient documentation

## 2018-01-11 DIAGNOSIS — F17210 Nicotine dependence, cigarettes, uncomplicated: Secondary | ICD-10-CM | POA: Insufficient documentation

## 2018-01-11 DIAGNOSIS — F329 Major depressive disorder, single episode, unspecified: Secondary | ICD-10-CM | POA: Insufficient documentation

## 2018-01-11 DIAGNOSIS — F419 Anxiety disorder, unspecified: Secondary | ICD-10-CM | POA: Insufficient documentation

## 2018-01-11 DIAGNOSIS — Z9114 Patient's other noncompliance with medication regimen: Secondary | ICD-10-CM | POA: Insufficient documentation

## 2018-01-11 HISTORY — DX: Other stimulant dependence, uncomplicated: F15.20

## 2018-01-11 HISTORY — DX: Opioid abuse, uncomplicated: F11.10

## 2018-01-11 LAB — COMPREHENSIVE METABOLIC PANEL
ALT: 13 U/L (ref 0–44)
AST: 16 U/L (ref 15–41)
Albumin: 3.6 g/dL (ref 3.5–5.0)
Alkaline Phosphatase: 60 U/L (ref 38–126)
Anion gap: 10 (ref 5–15)
BUN: 12 mg/dL (ref 6–20)
CHLORIDE: 102 mmol/L (ref 98–111)
CO2: 29 mmol/L (ref 22–32)
CREATININE: 0.71 mg/dL (ref 0.61–1.24)
Calcium: 9 mg/dL (ref 8.9–10.3)
Glucose, Bld: 114 mg/dL — ABNORMAL HIGH (ref 70–99)
POTASSIUM: 3.7 mmol/L (ref 3.5–5.1)
SODIUM: 141 mmol/L (ref 135–145)
Total Bilirubin: 0.5 mg/dL (ref 0.3–1.2)
Total Protein: 7 g/dL (ref 6.5–8.1)

## 2018-01-11 LAB — CBC
HCT: 43.4 % (ref 39.0–52.0)
HEMOGLOBIN: 14.2 g/dL (ref 13.0–17.0)
MCH: 30.5 pg (ref 26.0–34.0)
MCHC: 32.7 g/dL (ref 30.0–36.0)
MCV: 93.1 fL (ref 78.0–100.0)
PLATELETS: 313 10*3/uL (ref 150–400)
RBC: 4.66 MIL/uL (ref 4.22–5.81)
RDW: 12.2 % (ref 11.5–15.5)
WBC: 6.3 10*3/uL (ref 4.0–10.5)

## 2018-01-11 LAB — RAPID URINE DRUG SCREEN, HOSP PERFORMED
AMPHETAMINES: POSITIVE — AB
BENZODIAZEPINES: NOT DETECTED
Barbiturates: NOT DETECTED
Cocaine: NOT DETECTED
OPIATES: POSITIVE — AB
TETRAHYDROCANNABINOL: NOT DETECTED

## 2018-01-11 LAB — SALICYLATE LEVEL

## 2018-01-11 LAB — ETHANOL

## 2018-01-11 LAB — ACETAMINOPHEN LEVEL: Acetaminophen (Tylenol), Serum: <10 ug/mL — ABNORMAL LOW (ref 10–30)

## 2018-01-11 MED ORDER — CLONIDINE HCL 0.2 MG PO TABS: 0.1000 mg | ORAL_TABLET | Freq: Two times a day (BID) | ORAL | Status: DC

## 2018-01-11 MED ORDER — DICYCLOMINE HCL 20 MG PO TABS: 20.0000 mg | ORAL_TABLET | Freq: Four times a day (QID) | ORAL | Status: DC | PRN

## 2018-01-11 MED ORDER — NICOTINE 21 MG/24HR TD PT24
21.0000 mg | MEDICATED_PATCH | Freq: Every day | TRANSDERMAL | Status: DC
  Administered 2018-01-11: 21 mg via TRANSDERMAL
  Filled 2018-01-11: qty 1

## 2018-01-11 MED ORDER — NAPROXEN 250 MG PO TABS: 500.0000 mg | ORAL_TABLET | Freq: Two times a day (BID) | ORAL | Status: DC | PRN

## 2018-01-11 MED ORDER — ACETAMINOPHEN 325 MG PO TABS: 650.0000 mg | ORAL_TABLET | ORAL | Status: DC | PRN

## 2018-01-11 MED ORDER — LOPERAMIDE HCL 2 MG PO CAPS
2.0000 mg | ORAL_CAPSULE | ORAL | Status: DC | PRN
Start: 2018-01-11 — End: 2018-01-12

## 2018-01-11 MED ORDER — CLONIDINE HCL 0.2 MG PO TABS: 0.1000 mg | ORAL_TABLET | Freq: Every day | ORAL | Status: DC

## 2018-01-11 MED ORDER — ONDANSETRON 4 MG PO TBDP: 4.0000 mg | ORAL_TABLET | Freq: Four times a day (QID) | ORAL | Status: DC | PRN

## 2018-01-11 MED ORDER — METHOCARBAMOL 500 MG PO TABS: 500.0000 mg | ORAL_TABLET | Freq: Three times a day (TID) | ORAL | Status: DC | PRN

## 2018-01-11 MED ORDER — ALUM & MAG HYDROXIDE-SIMETH 200-200-20 MG/5ML PO SUSP: 30.0000 mL | Freq: Four times a day (QID) | ORAL | Status: DC | PRN

## 2018-01-11 MED ORDER — CLONIDINE HCL 0.2 MG PO TABS
0.1000 mg | ORAL_TABLET | Freq: Four times a day (QID) | ORAL | Status: DC
  Administered 2018-01-11: 0.1 mg via ORAL
  Filled 2018-01-11: qty 1

## 2018-01-11 MED ORDER — ZOLPIDEM TARTRATE 5 MG PO TABS: 5.0000 mg | ORAL_TABLET | Freq: Every evening | ORAL | Status: DC | PRN

## 2018-01-11 MED ORDER — HYDROXYZINE HCL 25 MG PO TABS: 25.0000 mg | ORAL_TABLET | Freq: Four times a day (QID) | ORAL | Status: DC | PRN
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
## 2018-01-11 NOTE — ED Notes (Signed)
TTS in process-Monique,RN  

## 2018-01-11 NOTE — ED Provider Notes (Signed)
[facility]   CSN: [id number] Arrival date & time: 01/11/18  1800     History   Chief Complaint Chief Complaint  Patient presents with  . Medical Clearance  . Suicidal    HPI Adam Riley is a 42 y.o. male with a PMHx of anxiety, depression, and polysubstance abuse, who presents to the ED with complaints of suicidal ideations without a plan, auditory hallucinations hearing voices telling him to hurt himself and to do drugs, and polysubstance abuse.  Patient states that he has had mental health issues in the past, has been using heroin and meth for about a year.  Today he started hearing voices that told him to hurt himself and to do drugs.  He injected heroin and meth around 12 PM.  He states that he went to [address] detox facility [address] but they sent him here "to make sure he was mentally stable".  He denies that they sent him here for labs/med clearance, states that he was specifically sent here for psychiatric evaluation.  He denies HI, visual hallucinations, or alcohol use.  He endorses tobacco use.  He is supposed to be taking medications but he is not sure what these are, he has not been on any medications in about a year.  He denies any medical complaints at this time and is here voluntarily.  The history is provided by the patient and medical records. No language interpreter was used.    Past Medical History:  Diagnosis Date  . Anxiety   . Depression   . Heroin abuse (HCC)   . Methamphetamine addiction [facility](HCC)     Patient Active Problem List   Diagnosis Date Noted  . Alcohol dependence with acute alcoholic intoxication (HCC) 05/04/2011    History reviewed. No pertinent surgical history.      Home Medications    Prior to Admission medications   Not on File    Family History History reviewed. No pertinent family history.  Social History Social History   Tobacco Use  . Smoking status: Current Every Day  Smoker    Packs/day: 1.00    Years: 15.00    Pack years: 15.00    Types: Cigarettes  Substance Use Topics  . Alcohol use: Not Currently    Alcohol/week: 75.6 oz    Types: 126 Cans of beer per week    Frequency: Never  . Drug use: Yes    Types: IV, Methamphetamines    Comment: heroin, meth, and morphine use every day     Allergies   Patient has no known allergies.   Review of Systems Review of Systems  Constitutional: Negative for chills and fever.  Respiratory: Negative for shortness of breath.   Cardiovascular: Negative for chest pain.  Gastrointestinal: Negative for abdominal pain, constipation, diarrhea, nausea and vomiting.  Genitourinary: Negative for dysuria and hematuria.  Musculoskeletal: Negative for arthralgias and myalgias.  Skin: Negative for color change.  Allergic/Immunologic: Negative for immunocompromised state.  Neurological: Negative for weakness and numbness.  Psychiatric/Behavioral: Positive for hallucinations and suicidal ideas. Negative for confusion.   All other systems reviewed and are negative for acute change except as noted in the HPI.    Physical Exam Updated Vital Signs BP (!) 141/95 (BP Location: Right Arm)   Pulse 91   Temp 98.7 F (37.1 C) (Oral)   Resp 16   Ht 5\' 5"  (1.651 m)   Wt 56.7 kg (125 lb)   SpO2 100%  BMI 20.80 kg/m   Physical Exam  Constitutional: He is oriented to person, place, and time. Vital signs are normal. He appears well-developed and well-nourished.  Non-toxic appearance. No distress.  Afebrile, nontoxic, NAD  HENT:  Head: Normocephalic and atraumatic.  Mouth/Throat: Oropharynx is clear and moist and mucous membranes are normal.  Eyes: Conjunctivae and EOM are normal. Right eye exhibits no discharge. Left eye exhibits no discharge.  Neck: Normal range of motion. Neck supple.  Cardiovascular: Normal rate, regular rhythm, normal heart sounds and intact distal pulses. Exam reveals no gallop and no friction rub.    No murmur heard. Pulmonary/Chest: Effort normal and breath sounds normal. No respiratory distress. He has no decreased breath sounds. He has no wheezes. He has no rhonchi. He has no rales.  Abdominal: Soft. Normal appearance and bowel sounds are normal. He exhibits no distension. There is no tenderness. There is no rigidity, no rebound, no guarding, no CVA tenderness, no tenderness at McBurney's point and negative Murphy's sign.  Musculoskeletal: Normal range of motion.  Neurological: He is alert and oriented to person, place, and time. He has normal strength. No sensory deficit.  Skin: Skin is warm, dry and intact. No rash noted.  Psychiatric: His mood appears anxious. He is actively hallucinating. He exhibits a depressed mood. He expresses suicidal ideation. He expresses no homicidal ideation. He expresses no suicidal plans and no homicidal plans.  Anxious and depressed affect, but pleasant and cooperative. Endorsing SI without a plan, denies HI, reports auditory hallucinations without visual hallucinations, doesn't seem to be responding to internal stimuli.   Nursing note and vitals reviewed.    ED Treatments / Results  Labs (all labs ordered are listed, but only abnormal results are displayed) Labs Reviewed  COMPREHENSIVE METABOLIC PANEL - Abnormal; Notable for the following components:      Result Value   Glucose, Bld 114 (*)    All other components within normal limits  ACETAMINOPHEN LEVEL - Abnormal; Notable for the following components:   Acetaminophen (Tylenol), Serum <10 (*)    All other components within normal limits  RAPID URINE DRUG SCREEN, HOSP PERFORMED - Abnormal; Notable for the following components:   Opiates POSITIVE (*)    Amphetamines POSITIVE (*)    All other components within normal limits  ETHANOL  SALICYLATE LEVEL  CBC    EKG None  Radiology No results found.  Procedures Procedures (including critical care time)  Medications Ordered in  ED Medications  cloNIDine (CATAPRES) tablet 0.1 mg (has no administration in time range)    Followed by  cloNIDine (CATAPRES) tablet 0.1 mg (has no administration in time range)    Followed by  cloNIDine (CATAPRES) tablet 0.1 mg (has no administration in time range)  dicyclomine (BENTYL) tablet 20 mg (has no administration in time range)  hydrOXYzine (ATARAX/VISTARIL) tablet 25 mg (has no administration in time range)  loperamide (IMODIUM) capsule 2-4 mg (has no administration in time range)  methocarbamol (ROBAXIN) tablet 500 mg (has no administration in time range)  naproxen (NAPROSYN) tablet 500 mg (has no administration in time range)  ondansetron (ZOFRAN-ODT) disintegrating tablet 4 mg (has no administration in time range)  acetaminophen (TYLENOL) tablet 650 mg (has no administration in time range)  zolpidem (AMBIEN) tablet 5 mg (has no administration in time range)  alum & mag hydroxide-simeth (MAALOX/MYLANTA) 200-200-20 MG/5ML suspension 30 mL (has no administration in time range)  nicotine (NICODERM CQ - dosed in mg/24 hours) patch 21 mg (has no administration in  time range)     Initial Impression / Assessment and Plan / ED Course  I have reviewed the triage vital signs and the nursing notes.  Pertinent labs & imaging results that were available during my care of the patient were reviewed by me and considered in my medical decision making (see chart for details).     42 y.o. male here with SI without a plan, Auditory hallucinations, and polysubstance abuse. Was going to go to detox facility on [address] however they sent him here due to his psychiatric complaints (not for med clearance, sent here for psych eval per pt). He denies HI or visual hallucinations, denies EtOH use. Reports tobacco use, smoking cessation was advised. Has no medical complaints at this time. Appears anxious and depressed but otherwise physical exam benign. Work up thus far reveals: CBC WNL. CMP WNL.  EtOH level undetectable. Salicylate and acetaminophen levels WNL. UDS with +opiates and +amphetamines but otherwise negative. Pt medically cleared at this time. Psych hold orders and withdrawal med orders placed. Please see TTS notes for further documentation of care/dispo. PLEASE NOTE THAT PT IS HERE VOLUNTARILY AT THIS TIME, IF PT TRIES TO LEAVE THEY WOULD NEED IVC PAPERWORK TAKEN OUT. Pt stable at time of med clearance.     Final Clinical Impressions(s) / ED Diagnoses   Final diagnoses:  Suicidal ideation  Polysubstance abuse [facility])  Auditory hallucinations  Tobacco user  Medical clearance for psychiatric admission    ED Discharge Orders    [address], [place], [place] 01/11/18 2105    Terrilee Files, MD 01/12/18 [phone]

## 2018-01-11 NOTE — ED Notes (Signed)
Pt given Mal[place]urkey sandwich meal and milk

## 2018-01-11 NOTE — ED Notes (Signed)
Pt has been wanded by security. 

## 2018-01-11 NOTE — ED Notes (Signed)
Pt belongings in cabinet 8 in pod F

## 2018-01-11 NOTE — ED Triage Notes (Signed)
Pt endorses "i've been doing heroin, meth and morphine and I am going to [facility] and they want me to come here and get medically cleared first. I've been shooting up for 1 year and today I was feeling suicidal and hearing voices" Pt states that he always shoots up when he hears voices. Pt has no plan for self harm. Pt has long hx of BH issues and drug abuse. VSS

## 2018-01-12 ENCOUNTER — Other Ambulatory Visit: Payer: Self-pay

## 2018-01-12 ENCOUNTER — Inpatient Hospital Stay (HOSPITAL_COMMUNITY)
Admission: AD | Admit: 2018-01-12 | Discharge: 2018-01-17 | DRG: 885 | Disposition: A | Discharge status: Home / Self Care | Payer: Federal, State, Local not specified - Other | Source: Intra-hospital | Attending: Psychiatry | Admitting: Psychiatry

## 2018-01-12 ENCOUNTER — Encounter (HOSPITAL_COMMUNITY): Payer: Self-pay

## 2018-01-12 DIAGNOSIS — Z818 Family history of other mental and behavioral disorders: Secondary | ICD-10-CM

## 2018-01-12 DIAGNOSIS — R45851 Suicidal ideations: Secondary | ICD-10-CM | POA: Diagnosis present

## 2018-01-12 DIAGNOSIS — Z590 Homelessness: Secondary | ICD-10-CM

## 2018-01-12 DIAGNOSIS — F17210 Nicotine dependence, cigarettes, uncomplicated: Secondary | ICD-10-CM | POA: Diagnosis present

## 2018-01-12 DIAGNOSIS — F419 Anxiety disorder, unspecified: Secondary | ICD-10-CM | POA: Diagnosis present

## 2018-01-12 DIAGNOSIS — F332 Major depressive disorder, recurrent severe without psychotic features: Secondary | ICD-10-CM | POA: Diagnosis not present

## 2018-01-12 DIAGNOSIS — Z915 Personal history of self-harm: Secondary | ICD-10-CM | POA: Diagnosis not present

## 2018-01-12 DIAGNOSIS — F1120 Opioid dependence, uncomplicated: Secondary | ICD-10-CM | POA: Diagnosis present

## 2018-01-12 DIAGNOSIS — B1920 Unspecified viral hepatitis C without hepatic coma: Secondary | ICD-10-CM | POA: Diagnosis present

## 2018-01-12 DIAGNOSIS — F1124 Opioid dependence with opioid-induced mood disorder: Secondary | ICD-10-CM | POA: Diagnosis not present

## 2018-01-12 DIAGNOSIS — R030 Elevated blood-pressure reading, without diagnosis of hypertension: Secondary | ICD-10-CM | POA: Diagnosis present

## 2018-01-12 DIAGNOSIS — Z560 Unemployment, unspecified: Secondary | ICD-10-CM | POA: Diagnosis not present

## 2018-01-12 DIAGNOSIS — F1520 Other stimulant dependence, uncomplicated: Secondary | ICD-10-CM | POA: Diagnosis present

## 2018-01-12 DIAGNOSIS — F322 Major depressive disorder, single episode, severe without psychotic features: Secondary | ICD-10-CM | POA: Diagnosis not present

## 2018-01-12 DIAGNOSIS — Z8279 Family history of other congenital malformations, deformations and chromosomal abnormalities: Secondary | ICD-10-CM | POA: Diagnosis not present

## 2018-01-12 DIAGNOSIS — F323 Major depressive disorder, single episode, severe with psychotic features: Principal | ICD-10-CM | POA: Diagnosis present

## 2018-01-12 MED ORDER — PNEUMOCOCCAL VAC POLYVALENT 25 MCG/0.5ML IJ INJ: 0.5000 mL | INJECTION | INTRAMUSCULAR | Status: DC

## 2018-01-12 MED ORDER — QUETIAPINE FUMARATE 25 MG PO TABS
25.0000 mg | ORAL_TABLET | Freq: Two times a day (BID) | ORAL | Status: DC
  Administered 2018-01-12 – 2018-01-14 (×4): 25 mg via ORAL
  Filled 2018-01-12 (×8): qty 1

## 2018-01-12 MED ORDER — MAGNESIUM HYDROXIDE 400 MG/5ML PO SUSP: 30.0000 mL | Freq: Every day | ORAL | Status: DC | PRN

## 2018-01-12 MED ORDER — NAPROXEN 500 MG PO TABS
500.0000 mg | ORAL_TABLET | Freq: Two times a day (BID) | ORAL | Status: AC | PRN
Start: 2018-01-12 — End: 2018-01-17
  Administered 2018-01-12: 500 mg via ORAL
  Filled 2018-01-12: qty 1

## 2018-01-12 MED ORDER — ONDANSETRON 4 MG PO TBDP: 4.0000 mg | ORAL_TABLET | Freq: Four times a day (QID) | ORAL | Status: AC | PRN

## 2018-01-12 MED ORDER — DULOXETINE HCL 30 MG PO CPEP
30.0000 mg | ORAL_CAPSULE | Freq: Every day | ORAL | Status: DC
  Administered 2018-01-12 – 2018-01-14 (×3): 30 mg via ORAL
  Filled 2018-01-12 (×6): qty 1

## 2018-01-12 MED ORDER — CLONIDINE HCL 0.1 MG PO TABS
0.1000 mg | ORAL_TABLET | ORAL | Status: DC
  Administered 2018-01-14 – 2018-01-15 (×2): 0.1 mg via ORAL
  Filled 2018-01-12 (×4): qty 1

## 2018-01-12 MED ORDER — CLONIDINE HCL 0.1 MG PO TABS
0.1000 mg | ORAL_TABLET | Freq: Four times a day (QID) | ORAL | Status: AC
  Administered 2018-01-12 – 2018-01-14 (×7): 0.1 mg via ORAL
  Filled 2018-01-12 (×9): qty 1

## 2018-01-12 MED ORDER — ACETAMINOPHEN 325 MG PO TABS: 650.0000 mg | ORAL_TABLET | Freq: Four times a day (QID) | ORAL | Status: DC | PRN

## 2018-01-12 MED ORDER — ALUM & MAG HYDROXIDE-SIMETH 200-200-20 MG/5ML PO SUSP: 30.0000 mL | ORAL | Status: DC | PRN

## 2018-01-12 MED ORDER — LOPERAMIDE HCL 2 MG PO CAPS: 2.0000 mg | ORAL_CAPSULE | ORAL | Status: AC | PRN

## 2018-01-12 MED ORDER — CLONIDINE HCL 0.1 MG PO TABS
0.1000 mg | ORAL_TABLET | Freq: Every day | ORAL | Status: DC
  Filled 2018-01-12: qty 1

## 2018-01-12 MED ORDER — HYDROXYZINE HCL 25 MG PO TABS
25.0000 mg | ORAL_TABLET | Freq: Four times a day (QID) | ORAL | Status: AC | PRN
  Administered 2018-01-12 – 2018-01-17 (×8): 25 mg via ORAL
  Filled 2018-01-12: qty 10
  Filled 2018-01-12 (×8): qty 1

## 2018-01-12 MED ORDER — DICYCLOMINE HCL 20 MG PO TABS: 20.0000 mg | ORAL_TABLET | Freq: Four times a day (QID) | ORAL | Status: DC | PRN

## 2018-01-12 MED ORDER — METHOCARBAMOL 500 MG PO TABS
500.0000 mg | ORAL_TABLET | Freq: Three times a day (TID) | ORAL | Status: AC | PRN
  Administered 2018-01-12: 500 mg via ORAL
  Filled 2018-01-12: qty 1

## 2018-01-12 NOTE — ED Notes (Signed)
The patient discharged..  Needed to do more charting 8:30 Patient was awake hands visible  The bed cord was secure at 7:30 am.

## 2018-01-12 NOTE — Progress Notes (Signed)
Patient ID: Adam Riley, male   DOB: January 24, 1976, 42 y.o.   MRN: [id number] Patient admitted to the unit due to desire to detox from heroin and crystal meth.  Patient reports a long period of feeling depressed, unable to focus and endorsed auditory hallucinations.  Patient also reported having a history of prior suicide attempts but doesn't report any SI at this time.    Patient currently complains of impending withdrawal symptoms to include restless legs, irritability, agitation GI upset, tremor and hot and cold flashes.  Skin assessment complete patient found to be free of all injury and no contraband present. Patient admitted to the unit without further incident.

## 2018-01-12 NOTE — [facility] Group Notes (Signed)
LCSW Group Therapy Note 01/12/2018 3:20 PM  Type of Therapy and Topic: Group Therapy: Avoiding Self-Sabotaging and Enabling Behaviors  Participation Level: Did Not Attend  Description of Group:  In this group, patients will learn how to identify obstacles, self-sabotaging and enabling behaviors, as well as: what are they, why do we do them and what needs these behaviors meet. Discuss unhealthy relationships and how to have positive healthy boundaries with those that sabotage and enable. Explore aspects of self-sabotage and enabling in yourself and how to limit these self-destructive behaviors in everyday life.  Therapeutic Goals: 1. Patient will identify one obstacle that relates to self-sabotage and enabling behaviors 2. Patient will identify one personal self-sabotaging or enabling behavior they did prior to admission 3. Patient will state a plan to change the above identified behavior 4. Patient will demonstrate ability to communicate their needs through discussion and/or role play.   Summary of Patient Progress:  Invited, chose not to attend.     Therapeutic Modalities:  Cognitive Behavioral Therapy Person-Centered Therapy Motivational Interviewing   Baldo DaubJolan Wretha Laris LCSWA Clinical Social Worker

## 2018-01-12 NOTE — ED Notes (Signed)
[facility] [place] [facility] [facility] dr Juleen Chinakohut made aware of pt going , pt signed transfer , [facility] notified to come and transport pt

## 2018-01-12 NOTE — Progress Notes (Signed)
Pt has been easily agitated and irritable since his admission onto the unit. Pt observed yelling, cursing and disruptive on the milieu.   On arrival, Pt stated, I need something for my anxiety before I go off. Pt was given Vistaril at that time. Pt was then seen by Dr. Jama Flavorsobos and started on Seroquel and Cymbalta.   After returning from supper, Pt c/o of not being able to breathe.  Pt was assessed by Maryanna ShapeAylssa, RN., and v/s noted to be stable. Writer f/u with pt and pt noted to be stable and not in distress. Writer was standing in the hallway and could hear pt yelling and cursing. Pt was observed agitated because his pillow was sliding from under his head. Writer spoke with pt about his choice of words and his tone. Pt encouraged not to disrupt the milieu. Writer explained to pt that if he can not maintain control he will need to go to the quiet room.

## 2018-01-12 NOTE — H&P (Signed)
Psychiatric Admission Assessment Adult  Patient Identification: Adam Riley MRN:  [id number] Date of Evaluation:  01/12/2018 Chief Complaint:  " I have been depressed forever" Principal Diagnosis: Methamphetamine Dependence, Opiate Dependence, Substance Induced Mood Disorder, Depressed versus MDD Diagnosis:   Patient Active Problem List   Diagnosis Date Noted  . MDD (major depressive disorder) [F32.9] 01/12/2018  . Alcohol dependence with acute alcoholic intoxication [facility]) [F10.229] 05/04/2011   History of Present Illness: 42 year old male, currently unemployed, homeless. Presented to the hospital voluntarily reporting methamphetamine and heroin abuse, worsening depression. Reports daily IV methamphetamine and regular ( several times a week) heroin abuse.  In addition to substance abuse reports long history of depression, and endorses neuro-vegetative symptoms as below. He also reports  he experiences auditory hallucinations intermittently, which tell him to hurt himself. He states he feels depression is persistent and present even when sober .  Endorses neuro-vegetative symptoms of depression as below. Currently presents depressed, dysphoric , irritable, and describes some symptoms of opiate withdrawal, such as chills, " hot and cold flashes", nausea. No vomiting .  Admission BAL negative, admission UDS positive for amphetamines and for opiates    Associated Signs/Symptoms: Depression Symptoms:  depressed mood, anhedonia, suicidal thoughts with specific plan, loss of energy/fatigue, decreased appetite, (Hypo) Manic Symptoms:  irritability Anxiety Symptoms: reports increased anxiety Psychotic Symptoms:  Reports intermittent auditory hallucinations , mostly at night, tell him to " f.. Myself up". Currently does not appear internally preoccupied  PTSD Symptoms: Denies  Total Time spent with patient: 45 minutes  Past Psychiatric History: reports history of several psychiatric admissions ,  last time about 2 years ago. Reports history of prior suicide attempt by overdosing on drugs. History of chronic depression. States he has brief mood swings, usually lasting " only a few moments ", but does not currently describe any clear history of mania. History of chronic, intermittent auditory hallucinations, which he states have been occurring intermittently for many years . Denies history of violence . Does not endorse PTSD symptoms.   Is the patient at risk to self? Yes.    Has the patient been a risk to self in the past 6 months? Yes.    Has the patient been a risk to self within the distant past? No.  Is the patient a risk to others? No.  Has the patient been a risk to others in the past 6 months? No.  Has the patient been a risk to others within the distant past? No.   Prior Inpatient Therapy:  as above Prior Outpatient Therapy:  no current or recent outpatient psychiatric treatment   Alcohol Screening: 1. How often do you have a drink containing alcohol?: Never 2. How many drinks containing alcohol do you have on a typical day when you are drinking?: 1 or 2 3. How often do you have six or more drinks on one occasion?: Never AUDIT-C Score: 0 4. How often during the last year have you found that you were not able to stop drinking once you had started?: Never 5. How often during the last year have you failed to do what was normally expected from you becasue of drinking?: Never 6. How often during the last year have you needed a first drink in the morning to get yourself going after a heavy drinking session?: Never 7. How often during the last year have you had a feeling of guilt of remorse after drinking?: Never 8. How often during the last year have you  been unable to remember what happened the night before because you had been drinking?: Never 9. Have you or someone else been injured as a result of your drinking?: No 10. Has a relative or friend or a doctor or another health worker  been concerned about your drinking or suggested you cut down?: No Alcohol Use Disorder Identification Test Final Score (AUDIT): 0 Substance Abuse History in the last 12 months:  Denies alcohol abuse, describes history of IV methamphetamine dependence , has been using daily. To a lesser degree he has been abusing IV heroin , on average 3-4 x per week. Consequences of Substance Abuse: Loss of relationships, financial losses  Previous Psychotropic Medications: states he was not taking any psychiatric medications for more than a year. States he has been on "  a lot of medications" in the past for depression and anxiety, but states he does not remember specific medications or names Psychological Evaluations:  No  Past Medical History:  Denies medical illnesses  Past Medical History:  Diagnosis Date  . Anxiety   . Depression   . Heroin abuse ([place])   . Methamphetamine addiction ([place])    History reviewed. No pertinent surgical history. Family History: parents alive, separated , has one brother and one sister  Family Psychiatric  History: reports maternal uncle had history of depression, anxiety. Brother has history of  Down Syndrome. No suicides in family Tobacco Screening:  smokes 1 PPD Social History: 42 year old single male, has three children, who live with their mother, currently homeless. Unemployed . No legal issues . Social History   Substance and Sexual Activity  Alcohol Use Not Currently  . Alcohol/week: 126.0 standard drinks  . Types: 126 Cans of beer per week  . Frequency: Never     Social History   Substance and Sexual Activity  Drug Use Yes  . Types: IV, Methamphetamines   Comment: heroin, meth, and morphine use every day    Additional Social History: Marital status: Single Are you sexually active?: No What is your sexual orientation?: Heterosexual  Has your sexual activity been affected by drugs, alcohol, medication, or emotional stress?: No  Does patient have children?:  Yes How many children?: 3 How is patient's relationship with their children?: Patient reports not having a good relationship with his children. He reports having a 3yo son, 59yo daughter, and a 66yo son.   Allergies:  No Known Allergies Lab Results:  Results for orders placed or performed during the hospital encounter of 01/11/18 (from the past 48 hour(s))  Rapid urine drug screen (hospital performed)     Status: Abnormal   Collection Time: 01/11/18  6:45 PM  Result Value Ref Range   Opiates POSITIVE (A) NONE DETECTED   Cocaine NONE DETECTED NONE DETECTED   Benzodiazepines NONE DETECTED NONE DETECTED   Amphetamines POSITIVE (A) NONE DETECTED   Tetrahydrocannabinol NONE DETECTED NONE DETECTED   Barbiturates NONE DETECTED NONE DETECTED    Comment: (NOTE) DRUG SCREEN FOR MEDICAL PURPOSES ONLY.  IF CONFIRMATION IS NEEDED FOR ANY PURPOSE, NOTIFY LAB WITHIN 5 DAYS. LOWEST DETECTABLE LIMITS FOR URINE DRUG SCREEN Drug Class                     Cutoff (ng/mL) Amphetamine and metabolites    1000 Barbiturate and metabolites    200 Benzodiazepine                 294 Tricyclics and metabolites     300 Opiates and  metabolites        300 Cocaine and metabolites        300 THC                            50 Performed at [facility], [place] [address], [place], [place] [address]   Comprehensive metabolic panel     Status: Abnormal   Collection Time: 01/11/18  6:47 PM  Result Value Ref Range   Sodium 141 135 - 145 mmol/L   Potassium 3.7 3.5 - 5.1 mmol/L   Chloride 102 98 - 111 mmol/L   CO2 29 22 - 32 mmol/L   Glucose, Bld 114 (H) 70 - 99 mg/dL   BUN 12 6 - 20 mg/dL   Creatinine, Ser 0.71 0.61 - 1.24 mg/dL   Calcium 9.0 8.9 - 10.3 mg/dL   Total Protein 7.0 6.5 - 8.1 g/dL   Albumin 3.6 3.5 - 5.0 g/dL   AST 16 15 - 41 U/L   ALT 13 0 - 44 U/L   Alkaline Phosphatase 60 38 - 126 U/L   Total Bilirubin 0.5 0.3 - 1.2 mg/dL   GFR calc non Af Amer >60 >60 mL/min   GFR calc Af Amer >60 >60  mL/min    Comment: (NOTE) The eGFR has been calculated using the CKD EPI equation. This calculation has not been validated in all clinical situations. eGFR's persistently <60 mL/min signify possible Chronic Kidney Disease.    Anion gap 10 5 - 15    Comment: Performed at [facility] [address] [place], [place] [address]  Ethanol     Status: None   Collection Time: 01/11/18  6:47 PM  Result Value Ref Range   Alcohol, Ethyl (B) <10 <10 mg/dL    Comment: (NOTE) Lowest detectable limit for serum alcohol is 10 mg/dL. For medical purposes only. Performed at [facility], [place] [address], [place], [place] [address]   Salicylate level     Status: None   Collection Time: 01/11/18  6:47 PM  Result Value Ref Range   Salicylate Lvl <8.0 2.8 - 30.0 mg/dL    Comment: Performed at [facility] [address], [place], [place] [address]  Acetaminophen level     Status: Abnormal   Collection Time: 01/11/18  6:47 PM  Result Value Ref Range   Acetaminophen (Tylenol), Serum <10 (L) 10 - 30 ug/mL    Comment: (NOTE) Therapeutic concentrations vary significantly. A range of 10-30 ug/mL  may be an effective concentration for many patients. However, some  are best treated at concentrations outside of this range. Acetaminophen concentrations >150 ug/mL at 4 hours after ingestion  and >50 ug/mL at 12 hours after ingestion are often associated with  toxic reactions. Performed at [facility], [place] [address] [place] [address]   cbc     Status: None   Collection Time: 01/11/18  6:47 PM  Result Value Ref Range   WBC 6.3 4.0 - 10.5 K/uL   RBC 4.66 4.22 - 5.81 MIL/uL   Hemoglobin 14.2 13.0 - 17.0 g/dL   HCT 43.4 39.0 - 52.0 %   MCV 93.1 78.0 - 100.0 fL   MCH 30.5 26.0 - 34.0 pg   MCHC 32.7 30.0 - 36.0 g/dL   RDW 12.2 11.5 - 15.5 %   Platelets 313 150 - 400 K/uL    Comment: Performed at [facility], [place] [address] [place], [place] [address]    Blood  Alcohol level:  Lab Results  Component Value Date   ETH <10 01/11/2018   ETH 254 (H) 59/93/5701    Metabolic Disorder Labs:  No results found for: HGBA1C, MPG No results found for: PROLACTIN No results found for: CHOL, TRIG, HDL, CHOLHDL, VLDL, LDLCALC  Current Medications: Current Facility-Administered Medications  Medication Dose Route Frequency Provider Last Rate Last Dose  . acetaminophen (TYLENOL) tablet 650 mg  650 mg Oral Q6H PRN Mordecai Maes, NP      . alum & mag hydroxide-simeth (MAALOX/MYLANTA) 200-200-20 MG/5ML suspension 30 mL  30 mL Oral Q4H PRN Mordecai Maes, NP      . cloNIDine (CATAPRES) tablet 0.1 mg  0.1 mg Oral QID Lindell Spar I, NP   0.1 mg at 01/12/18 1215   Followed by  . [START ON 01/14/2018] cloNIDine (CATAPRES) tablet 0.1 mg  0.1 mg Oral BH-qamhs Nwoko, Agnes I, NP       Followed by  . [START ON 01/17/2018] cloNIDine (CATAPRES) tablet 0.1 mg  0.1 mg Oral QAC breakfast Nwoko, Agnes I, NP      . dicyclomine (BENTYL) tablet 20 mg  20 mg Oral Q6H PRN Nwoko, Agnes I, NP      . hydrOXYzine (ATARAX/VISTARIL) tablet 25 mg  25 mg Oral Q6H PRN Lindell Spar I, NP   25 mg at 01/12/18 1052  . loperamide (IMODIUM) capsule 2-4 mg  2-4 mg Oral PRN Lindell Spar I, NP      . magnesium hydroxide (MILK OF MAGNESIA) suspension 30 mL  30 mL Oral Daily PRN Mordecai Maes, NP      . methocarbamol (ROBAXIN) tablet 500 mg  500 mg Oral Q8H PRN Nwoko, Agnes I, NP      . naproxen (NAPROSYN) tablet 500 mg  500 mg Oral BID PRN Lindell Spar I, NP      . ondansetron (ZOFRAN-ODT) disintegrating tablet 4 mg  4 mg Oral Q6H PRN Lindell Spar I, NP      . Derrill Memo ON 01/13/2018] pneumococcal 23 valent vaccine (PNU-IMMUNE) injection 0.5 mL  0.5 mL Intramuscular Tomorrow-1000 Aragorn Recker, Myer Peer, MD       PTA Medications: No medications prior to admission.    Musculoskeletal: Strength & Muscle Tone: within normal limits Gait & Station: normal Patient leans: N/A  Psychiatric Specialty  Exam: Physical Exam  Review of Systems  Constitutional: Positive for chills.  HENT: Negative.   Eyes: Negative.   Respiratory: Negative.   Cardiovascular: Negative.   Gastrointestinal: Positive for nausea. Negative for diarrhea and vomiting.  Genitourinary: Negative.   Musculoskeletal: Negative.   Skin: Negative.   Neurological: Negative for seizures and headaches.  Endo/Heme/Allergies: Negative.   Psychiatric/Behavioral: Positive for depression, hallucinations, substance abuse and suicidal ideas.  All other systems reviewed and are negative.   Blood pressure (!) 131/97, pulse 85, temperature 98.6 F (37 C), temperature source Oral, resp. rate 16, height '5\' 3"'$  (1.6 m), weight 50.3 kg, SpO2 100 %.Body mass index is 19.66 kg/m.  General Appearance: Fairly Groomed  Eye Contact:  Minimal  Speech:  intermittently loud   Volume:  Decreased  Mood:  Dysphoric and depressed,vaguely irritable  Affect:  congruent, dysphoric  Thought Process:  Linear and Descriptions of Associations: Intact  Orientation:  Other:  fully alert and attentive  Thought Content:  reports intermittent auditory hallucinations, last experienced them yesterday, does not currently appear internally preoccupied, no delusions expressed  Suicidal Thoughts:  No denies suicidal or self injurious ideations, denies homicidal or violent ideations, contracts for safety on unit   Homicidal Thoughts:  No  Memory:  recent and remote fair   Judgement:  Fair  Insight:  Fair  Psychomotor Activity:  mild psychomotor restlessness   Concentration:  Concentration: Fair and Attention Span: Fair  Recall:  [employer] of Knowledge:  Fair  Language:  Fair  Akathisia:  Negative  Handed:  Right  AIMS (if indicated):     Assets:  Desire for Improvement Resilience  ADL's:  Intact  Cognition:  WNL  Sleep:       Treatment Plan Summary: Daily contact with patient to assess and evaluate symptoms and progress in treatment, Medication  management, Plan inpatient treatment and medications as below  Observation Level/Precautions:  15 minute checks  Laboratory:  as needed  patient requests being tested for HIV and viral hepatitis. Will also request HgbA1C, Lipid Panel, TSH.  Psychotherapy:  Milieu , group therapy   Medications:  Clonidine detox protocol. We discussed treatment options, requests Adderall trial- we reviewed rationale to avoid, minimize potentially addictive medications/stimulants. Agrees to Cymbalta trial. Start Cymbalta 30 mgrs QDAY initially. Start Seroquel 25 mgrs BID for mood disorder, depression, hallucinations. 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   [name] Term Goal(s): Improvement in symptoms so as ready for discharge  Short Term Goals: Ability to identify changes in lifestyle to reduce recurrence of condition will improve and Ability to maintain clinical measurements within normal limits will improve  Physician Treatment Plan for Secondary Diagnosis: MDD with psychotic features  [name] Term Goal(s): Improvement in symptoms so as ready for discharge  Short Term Goals: Ability to identify changes in lifestyle to reduce recurrence of condition will improve, Ability to verbalize feelings will improve, Ability to disclose and discuss suicidal ideas, Ability to demonstrate self-control will improve, Ability to identify and develop effective coping behaviors will improve and Ability to maintain clinical measurements within normal limits will improve  I certify that inpatient services furnished can reasonably be expected to improve the patient's condition.    Jenne Campus, MD 8/8/20193:45 PM

## 2018-01-12 NOTE — BH Assessment (Signed)
[facility]      [facility]18 [facility] can arrive after 8 am.  Donell SievertSpencer Simon, NP accepted the patient and Dr. Jama Flavorsobos is the attending physician.  Call report to [phone].
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
## 2018-01-12 NOTE — Progress Notes (Signed)
Pt did not attend wrap up group this evening.  

## 2018-01-13 LAB — HEMOGLOBIN A1C
Hgb A1c MFr Bld: 5.6 % (ref 4.8–5.6)
MEAN PLASMA GLUCOSE: 114.02 mg/dL

## 2018-01-13 LAB — LIPID PANEL
CHOL/HDL RATIO: 4.5 ratio
CHOLESTEROL: 179 mg/dL (ref 0–200)
HDL: 40 mg/dL — ABNORMAL LOW (ref >40)
LDL Cholesterol: 70 mg/dL (ref 0–99)
Triglycerides: 344 mg/dL — ABNORMAL HIGH (ref <150)
VLDL: 69 mg/dL — ABNORMAL HIGH (ref 0–40)

## 2018-01-13 LAB — TSH: TSH: 0.798 u[IU]/mL (ref 0.350–4.500)

## 2018-01-13 NOTE — Progress Notes (Signed)
Writer observed patient at beginning of shift lying in his bed resting. He did not attend group but was compliant with scheduled medication. He has been isolative and resting this shift. Safety maintained on unit with 15 min checks.

## 2018-01-13 NOTE — Plan of Care (Signed)
Pt progressing in the following metrics  D: pt found in bed; pt compliant with medication administration. Pt denies any withdrawal symptoms. Pt denies any si/hi/ah/vh and verbally agrees to approach staff if these become apparent. Pt's blood pressure continues to trend high. Pt states his goal for today is to attend more groups.  A: pt provided support and encouragement. [id number] safety checks implemented and continued. Pt given medications per protocol and standing orders. R: pt safe on the unit. Will continue to monitor.   Problem: Education: Goal: Emotional status will improve Outcome: Progressing Goal: Verbalization of understanding the information provided will improve Outcome: Progressing   Problem: Activity: Goal: Interest or engagement in activities will improve Outcome: Progressing Goal: Sleeping patterns will improve Outcome: Progressing   Problem: Coping: Goal: Ability to demonstrate self-control will improve Outcome: Progressing   Problem: Health Behavior/Discharge Planning: Goal: Compliance with treatment plan for underlying cause of condition will improve Outcome: Progressing   Problem: Physical Regulation: Goal: Ability to maintain clinical measurements within normal limits will improve Outcome: Progressing   Problem: Safety: Goal: Periods of time without injury will increase Outcome: Progressing

## 2018-01-13 NOTE — Progress Notes (Signed)
Recreation Therapy Notes  Date: 8.9.19 Time: 0930 Location: [address] Dayroom  Group Topic: Stress Management  Goal Area(s) Addresses:  Patient will verbalize importance of using healthy stress management.  Patient will identify positive emotions associated with healthy stress management.   Intervention: Stress Management  Activity :  Guided Imagery.  LRT introduced the stress management technique of guided imagery.  LRT read a script to guide patients on mental vacation through a wildlife sanctuary.  Patients were to listen and follow along as LRT read script.  Education:  Stress Management, Discharge Planning.   Education Outcome: Acknowledges edcuation/In group clarification offered/Needs additional education  Clinical Observations/Feedback: Pt did not attend group.     Fredis Malkiewicz, LRT/CTRS         Emersyn Kotarski A 01/13/2018 11:07 AM 

## 2018-01-13 NOTE — Progress Notes (Signed)
D    Pt has been in his room all evening    He has made no complaints up to present time    A    Verbal support offered   Q 15 min checks R    Pt is safe at this time

## 2018-01-13 NOTE — Tx Team (Signed)
Interdisciplinary Treatment and Diagnostic Plan Update  01/13/2018 Time of Session: 10:40am Adam Riley MRN: [id number]  Principal Diagnosis: <principal problem not specified>  Secondary Diagnoses: Active Problems:   MDD (major depressive disorder)   Current Medications:  Current Facility-Administered Medications  Medication Dose Route Frequency Provider Last Rate Last Dose  . acetaminophen (TYLENOL) tablet 650 mg  650 mg Oral Q6H PRN Mordecai Maes, NP      . alum & mag hydroxide-simeth (MAALOX/MYLANTA) 200-200-20 MG/5ML suspension 30 mL  30 mL Oral Q4H PRN Mordecai Maes, NP      . cloNIDine (CATAPRES) tablet 0.1 mg  0.1 mg Oral QID Lindell Spar I, NP   0.1 mg at 01/13/18 0807   Followed by  . [START ON 01/14/2018] cloNIDine (CATAPRES) tablet 0.1 mg  0.1 mg Oral BH-qamhs Nwoko, Agnes I, NP       Followed by  . [START ON 01/17/2018] cloNIDine (CATAPRES) tablet 0.1 mg  0.1 mg Oral QAC breakfast Nwoko, Agnes I, NP      . dicyclomine (BENTYL) tablet 20 mg  20 mg Oral Q6H PRN Lindell Spar I, NP      . DULoxetine (CYMBALTA) DR capsule 30 mg  30 mg Oral Daily Cobos, Myer Peer, MD   30 mg at 01/13/18 0806  . hydrOXYzine (ATARAX/VISTARIL) tablet 25 mg  25 mg Oral Q6H PRN Lindell Spar I, NP   25 mg at 01/12/18 1052  . loperamide (IMODIUM) capsule 2-4 mg  2-4 mg Oral PRN Lindell Spar I, NP      . magnesium hydroxide (MILK OF MAGNESIA) suspension 30 mL  30 mL Oral Daily PRN Mordecai Maes, NP      . methocarbamol (ROBAXIN) tablet 500 mg  500 mg Oral Q8H PRN Lindell Spar I, NP   500 mg at 01/12/18 1848  . naproxen (NAPROSYN) tablet 500 mg  500 mg Oral BID PRN Lindell Spar I, NP   500 mg at 01/12/18 1848  . ondansetron (ZOFRAN-ODT) disintegrating tablet 4 mg  4 mg Oral Q6H PRN Lindell Spar I, NP      . pneumococcal 23 valent vaccine (PNU-IMMUNE) injection 0.5 mL  0.5 mL Intramuscular Tomorrow-1000 Cobos, Fernando A, MD      . QUEtiapine (SEROQUEL) tablet 25 mg  25 mg Oral BID Cobos, Myer Peer,  MD   25 mg at 01/13/18 0806   PTA Medications: No medications prior to admission.    Patient Stressors:    Patient Strengths:    Treatment Modalities: Medication Management, Group therapy, Case management,  1 to 1 session with clinician, Psychoeducation, Recreational therapy.   Physician Treatment Plan for Primary Diagnosis: <principal problem not specified> Long Term Goal(s): Improvement in symptoms so as ready for discharge Improvement in symptoms so as ready for discharge   Short Term Goals: Ability to identify changes in lifestyle to reduce recurrence of condition will improve Ability to maintain clinical measurements within normal limits will improve Ability to identify changes in lifestyle to reduce recurrence of condition will improve Ability to verbalize feelings will improve Ability to disclose and discuss suicidal ideas Ability to demonstrate self-control will improve Ability to identify and develop effective coping behaviors will improve Ability to maintain clinical measurements within normal limits will improve  Medication Management: Evaluate patient's response, side effects, and tolerance of medication regimen.  Therapeutic Interventions: 1 to 1 sessions, Unit Group sessions and Medication administration.  Evaluation of Outcomes: Not Met  Physician Treatment Plan for Secondary Diagnosis: Active Problems:   MDD (  major depressive disorder)  Long Term Goal(s): Improvement in symptoms so as ready for discharge Improvement in symptoms so as ready for discharge   Short Term Goals: Ability to identify changes in lifestyle to reduce recurrence of condition will improve Ability to maintain clinical measurements within normal limits will improve Ability to identify changes in lifestyle to reduce recurrence of condition will improve Ability to verbalize feelings will improve Ability to disclose and discuss suicidal ideas Ability to demonstrate self-control will  improve Ability to identify and develop effective coping behaviors will improve Ability to maintain clinical measurements within normal limits will improve     Medication Management: Evaluate patient's response, side effects, and tolerance of medication regimen.  Therapeutic Interventions: 1 to 1 sessions, Unit Group sessions and Medication administration.  Evaluation of Outcomes: Not Met   RN Treatment Plan for Primary Diagnosis: <principal problem not specified> Long Term Goal(s): Knowledge of disease and therapeutic regimen to maintain health will improve  Short Term Goals: Ability to verbalize feelings will improve, Ability to disclose and discuss suicidal ideas, Ability to identify and develop effective coping behaviors will improve and Compliance with prescribed medications will improve  Medication Management: RN will administer medications as ordered by provider, will assess and evaluate patient's response and provide education to patient for prescribed medication. RN will report any adverse and/or side effects to prescribing provider.  Therapeutic Interventions: 1 on 1 counseling sessions, Psychoeducation, Medication administration, Evaluate responses to treatment, Monitor vital signs and CBGs as ordered, Perform/monitor CIWA, COWS, AIMS and Fall Risk screenings as ordered, Perform wound care treatments as ordered.  Evaluation of Outcomes: Not Met   LCSW Treatment Plan for Primary Diagnosis: <principal problem not specified> Long Term Goal(s): Safe transition to appropriate next level of care at discharge, Engage patient in therapeutic group addressing interpersonal concerns.  Short Term Goals: Engage patient in aftercare planning with referrals and resources  Therapeutic Interventions: Assess for all discharge needs, 1 to 1 time with Social worker, Explore available resources and support systems, Assess for adequacy in community support network, Educate family and significant  other(s) on suicide prevention, Complete Psychosocial Assessment, Interpersonal group therapy.  Evaluation of Outcomes: Not Met   Progress in Treatment: Attending groups: No. Participating in groups: No. Taking medication as prescribed: Yes. Toleration medication: Yes. Family/Significant other contact made: No, will contact:  patient refuses consent Patient understands diagnosis: Yes. Discussing patient identified problems/goals with staff: Yes. Medical problems stabilized or resolved: Yes. Denies suicidal/homicidal ideation: Yes. Issues/concerns per patient self-inventory: No. Other:  New problem(s) identified: None   New Short Term/Long Term Goal(s):Detox, medication stabilization, elimination of SI thoughts, development of comprehensive mental wellness plan.   Patient Goals:    Discharge Plan or Barriers: CSW will assess for appropriate referrals and discharge planning.   Reason for Continuation of Hospitalization: Depression Medication stabilization  Estimated Length of Stay:3-5 days   Attendees: Patient: 01/13/2018 8:36 AM  Physician: Dr. Neita Garnet, MD 01/13/2018 8:36 AM  Nursing: Legrand Como RN 01/13/2018 8:36 AM  RN Care Manager:X 01/13/2018 8:36 AM  Social Worker: Radonna Ricker, Roy 01/13/2018 8:36 AM  Recreational Therapist: Rhunette Croft 01/13/2018 8:36 AM  Other: Rhunette Croft 01/13/2018 8:36 AM  Other: Rhunette Croft 01/13/2018 8:36 AM  Other:X 01/13/2018 8:36 AM    Scribe for Treatment Team: Marylee Floras, [place] 01/13/2018 8:36 AM

## 2018-01-13 NOTE — Progress Notes (Signed)
Psychoeducational Group Note  Date:  01/13/2018 Time:  2329  Group Topic/Focus:  Wrap-Up Group:   The focus of this group is to help patients review their daily goal of treatment and discuss progress on daily workbooks.  Participation Level: Did Not Attend  Participation Quality:  Not Applicable  Affect:  Not Applicable  Cognitive:  Not Applicable  Insight:  Not Applicable  Engagement in Group: Not Applicable  Additional Comments: The patient did not attend group since he was asleep in his bedroom.   Hazle CocaGOODMAN, Stori Royse S 01/13/2018, 11:29 PM

## 2018-01-13 NOTE — Progress Notes (Addendum)
[facility] MD Progress Note  01/13/2018 3:17 PM Adam Riley  MRN:  [id number] Subjective:  So far everything is going good. I think the Cymbalta is working. I haven't had any more shakes since I been off the heroin.   Objective: 42 year old male, currently unemployed, homeless. Presented to the hospital voluntarily reporting methamphetamine and heroin abuse, worsening depression. No withdrawal, urges, or cravings.He continues to endorse ongoing fatigue and malaise.  He reports his goal today is to stay awake more often and then attend all groups. He also states he was able to found housing, and came to the hospital for detox. "Im going to [facility] [address], and they told me to come here to get detox and call when Im ready. " At this time he reports no disturbance in eating or sleeping patterns. " I feel like Im eating too much." He denies any suicidal, homicidal ideations. He is able to contract for safety while on the unit.    Principal Problem: <principal problem not specified> Diagnosis:   Patient Active Problem List   Diagnosis Date Noted  . MDD (major depressive disorder) [F32.9] 01/12/2018  . Alcohol dependence with acute alcoholic intoxication [facility]) [F10.229] 05/04/2011   Total Time spent with patient: 30 minutes  Past Psychiatric History: reports history of several psychiatric admissions , last time about 2 years ago. Reports history of prior suicide attempt by overdosing on drugs. History of chronic depression. States he has brief mood swings, usually lasting " only a few moments ", but does not currently describe any clear history of mania. History of chronic, intermittent auditory hallucinations, which he states have been occurring intermittently for many years . Denies history of violence . Does not endorse PTSD symptoms.  Substance Abuse History in the last 12 months:  Denies alcohol abuse, describes history of IV methamphetamine dependence , has been using daily. To a lesser degree he has been  abusing IV heroin , on average 3-4 x per week. Consequences of Substance Abuse: Loss of relationships, financial losses  Previous Psychotropic Medications: states he was not taking any psychiatric medications for more than a year. States he has been on "  a lot of medications" in the past for depression and anxiety, but states he does not remember specific medications or names  Past Medical History:  Past Medical History:  Diagnosis Date  . Anxiety   . Depression   . Heroin abuse ([place])   . 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 No pertinent surgical history. Family History: History reviewed. No pertinent family history. Family Psychiatric  History: reports maternal uncle had history of depression, anxiety. Brother has history of  Down Syndrome. No suicides in family Tobacco Screening:  smokes 1 PPD  Social History:  Social History   Substance and Sexual Activity  Alcohol Use Not Currently  . Alcohol/week: 126.0 standard drinks  . Types: 126 Cans of beer per week  . Frequency: Never     Social History   Substance and Sexual Activity  Drug Use Yes  . Types: IV, Methamphetamines   Comment: heroin, meth, and morphine use every day    Social History   Socioeconomic History  . Marital status: Single    Spouse name: Not on file  . Number of children: Not on file  . Years of education: Not on file  . Highest education level: Not on file  Occupational History  . Not on file  Social Needs  . Financial resource strain: Not on  file  . Food insecurity:    Worry: Not on file    Inability: Not on file  . Transportation needs:    Medical: Not on file    Non-medical: Not on file  Tobacco Use  . Smoking status: Current Every Day Smoker    Packs/day: 1.00    Years: 15.00    Pack years: 15.00    Types: Cigarettes  . Smokeless tobacco: Current User  Substance and Sexual Activity  . Alcohol use: Not Currently    Alcohol/week: 126.0 standard drinks    Types: 126  Cans of beer per week    Frequency: Never  . Drug use: Yes    Types: IV, Methamphetamines    Comment: heroin, meth, and morphine use every day  . Sexual activity: Not on file  Lifestyle  . Physical activity:    Days per week: Not on file    Minutes per session: Not on file  . Stress: Not on file  Relationships  . Social connections:    Talks on phone: Not on file    Gets together: Not on file    Attends religious service: Not on file    Active member of club or organization: Not on file    Attends meetings of clubs or organizations: Not on file    Relationship status: Not on file  Other Topics Concern  . Not on file  Social History Narrative  . Not on file   Additional Social History:      Sleep: Fair  Appetite:  Fair  Current Medications: Current Facility-Administered Medications  Medication Dose Route Frequency Provider Last Rate Last Dose  . acetaminophen (TYLENOL) tablet 650 mg  650 mg Oral Q6H PRN Mordecai Maes, NP      . alum & mag hydroxide-simeth (MAALOX/MYLANTA) 200-200-20 MG/5ML suspension 30 mL  30 mL Oral Q4H PRN Mordecai Maes, NP      . cloNIDine (CATAPRES) tablet 0.1 mg  0.1 mg Oral QID Lindell Spar I, NP   0.1 mg at 01/13/18 1205   Followed by  . [START ON 01/14/2018] cloNIDine (CATAPRES) tablet 0.1 mg  0.1 mg Oral BH-qamhs Nwoko, Agnes I, NP       Followed by  . [START ON 01/17/2018] cloNIDine (CATAPRES) tablet 0.1 mg  0.1 mg Oral QAC breakfast Nwoko, Agnes I, NP      . dicyclomine (BENTYL) tablet 20 mg  20 mg Oral Q6H PRN Lindell Spar I, NP      . DULoxetine (CYMBALTA) DR capsule 30 mg  30 mg Oral Daily Langley Ingalls, Myer Peer, MD   30 mg at 01/13/18 0806  . hydrOXYzine (ATARAX/VISTARIL) tablet 25 mg  25 mg Oral Q6H PRN Lindell Spar I, NP   25 mg at 01/12/18 1052  . loperamide (IMODIUM) capsule 2-4 mg  2-4 mg Oral PRN Lindell Spar I, NP      . magnesium hydroxide (MILK OF MAGNESIA) suspension 30 mL  30 mL Oral Daily PRN Mordecai Maes, NP      .  methocarbamol (ROBAXIN) tablet 500 mg  500 mg Oral Q8H PRN Lindell Spar I, NP   500 mg at 01/12/18 1848  . naproxen (NAPROSYN) tablet 500 mg  500 mg Oral BID PRN Lindell Spar I, NP   500 mg at 01/12/18 1848  . ondansetron (ZOFRAN-ODT) disintegrating tablet 4 mg  4 mg Oral Q6H PRN Nwoko, Agnes I, NP      . pneumococcal 23 valent vaccine (PNU-IMMUNE) injection 0.5 mL  0.5  mL Intramuscular Tomorrow-1000 Gamal Todisco A, MD      . QUEtiapine (SEROQUEL) tablet 25 mg  25 mg Oral BID Hargun Spurling, Myer Peer, MD   25 mg at 01/13/18 0806    Lab Results:  Results for orders placed or performed during the hospital encounter of 01/11/18 (from the past 48 hour(s))  Rapid urine drug screen (hospital performed)     Status: Abnormal   Collection Time: 01/11/18  6:45 PM  Result Value Ref Range   Opiates POSITIVE (A) NONE DETECTED   Cocaine NONE DETECTED NONE DETECTED   Benzodiazepines NONE DETECTED NONE DETECTED   Amphetamines POSITIVE (A) NONE DETECTED   Tetrahydrocannabinol NONE DETECTED NONE DETECTED   Barbiturates NONE DETECTED NONE DETECTED    Comment: (NOTE) DRUG SCREEN FOR MEDICAL PURPOSES ONLY.  IF CONFIRMATION IS NEEDED FOR ANY PURPOSE, NOTIFY LAB WITHIN 5 DAYS. LOWEST DETECTABLE LIMITS FOR URINE DRUG SCREEN Drug Class                     Cutoff (ng/mL) Amphetamine and metabolites    1000 Barbiturate and metabolites    200 Benzodiazepine                 132 Tricyclics and metabolites     300 Opiates and metabolites        300 Cocaine and metabolites        300 THC                            50 Performed at [facility], [place] [address] [place], [place] [address]   Comprehensive metabolic panel     Status: Abnormal   Collection Time: 01/11/18  6:47 PM  Result Value Ref Range   Sodium 141 135 - 145 mmol/L   Potassium 3.7 3.5 - 5.1 mmol/L   Chloride 102 98 - 111 mmol/L   CO2 29 22 - 32 mmol/L   Glucose, Bld 114 (H) 70 - 99 mg/dL   BUN 12 6 - 20 mg/dL   Creatinine, Ser 0.71 0.61 -  1.24 mg/dL   Calcium 9.0 8.9 - 10.3 mg/dL   Total Protein 7.0 6.5 - 8.1 g/dL   Albumin 3.6 3.5 - 5.0 g/dL   AST 16 15 - 41 U/L   ALT 13 0 - 44 U/L   Alkaline Phosphatase 60 38 - 126 U/L   Total Bilirubin 0.5 0.3 - 1.2 mg/dL   GFR calc non Af Amer >60 >60 mL/min   GFR calc Af Amer >60 >60 mL/min    Comment: (NOTE) The eGFR has been calculated using the CKD EPI equation. This calculation has not been validated in all clinical situations. eGFR's persistently <60 mL/min signify possible Chronic Kidney Disease.    Anion gap 10 5 - 15    Comment: Performed at [facility] [address] [place], [place] [address]  Ethanol     Status: None   Collection Time: 01/11/18  6:47 PM  Result Value Ref Range   Alcohol, Ethyl (B) <10 <10 mg/dL    Comment: (NOTE) Lowest detectable limit for serum alcohol is 10 mg/dL. For medical purposes only. Performed at [facility], [place] [address], [place], [place] [address]   Salicylate level     Status: None   Collection Time: 01/11/18  6:47 PM  Result Value Ref Range   Salicylate Lvl <3.4 2.8 - 30.0 mg/dL  Comment: Performed at [facility], [place] [address], [place], [place] [address]  Acetaminophen level     Status: Abnormal   Collection Time: 01/11/18  6:47 PM  Result Value Ref Range   Acetaminophen (Tylenol), Serum <10 (L) 10 - 30 ug/mL    Comment: (NOTE) Therapeutic concentrations vary significantly. A range of 10-30 ug/mL  may be an effective concentration for many patients. However, some  are best treated at concentrations outside of this range. Acetaminophen concentrations >150 ug/mL at 4 hours after ingestion  and >50 ug/mL at 12 hours after ingestion are often associated with  toxic reactions. Performed at [facility], [place] [address], [place], [place] [address]   cbc     Status: None   Collection Time: 01/11/18  6:47 PM  Result Value Ref Range   WBC 6.3 4.0 - 10.5 K/uL   RBC 4.66 4.22 - 5.81 MIL/uL    Hemoglobin 14.2 13.0 - 17.0 g/dL   HCT 43.4 39.0 - 52.0 %   MCV 93.1 78.0 - 100.0 fL   MCH 30.5 26.0 - 34.0 pg   MCHC 32.7 30.0 - 36.0 g/dL   RDW 12.2 11.5 - 15.5 %   Platelets 313 150 - 400 K/uL    Comment: Performed at [facility], [place] [address], [place], [place] [address]    Blood Alcohol level:  Lab Results  Component Value Date   ETH <10 01/11/2018   ETH 254 (H) 28/00/3491    Metabolic Disorder Labs: No results found for: HGBA1C, MPG No results found for: PROLACTIN No results found for: CHOL, TRIG, HDL, CHOLHDL, VLDL, LDLCALC  Physical Findings: AIMS: Facial and Oral Movements Muscles of Facial Expression: None, normal Lips and Perioral Area: None, normal Jaw: None, normal Tongue: None, normal,Extremity Movements Upper (arms, wrists, hands, fingers): None, normal Lower (legs, knees, ankles, toes): None, normal, Trunk Movements Neck, shoulders, hips: None, normal, Overall Severity Severity of abnormal movements (highest score from questions above): None, normal Incapacitation due to abnormal movements: None, normal Patient's awareness of abnormal movements (rate only patient's report): No Awareness, Dental Status Current problems with teeth and/or dentures?: No Does patient usually wear dentures?: No  CIWA:    COWS:  COWS Total Score: 2  Musculoskeletal: Strength & Muscle Tone: within normal limits Gait & Station: normal Patient leans: N/A  Psychiatric Specialty Exam: Physical Exam  ROS  Blood pressure (!) 141/105, pulse 97, temperature 97.8 F (36.6 C), temperature source Oral, resp. rate 16, height _0  (1.6 m), weight 50.3 kg, SpO2 100 %.Body mass index is 19.66 kg/m.  General Appearance: Fairly Groomed and flushed face  Eye Contact:  Fair  Speech:  Clear and Coherent  Volume:  Normal  Mood:  improved  Affect:  Congruent  Thought Process:  Coherent, Linear and Descriptions of Associations: Intact  Orientation:  Full (Time, Place, and Person)   Thought Content:  Logical  Suicidal Thoughts:  No  Homicidal Thoughts:  No  Memory:  Immediate;   Fair Recent;   Fair  Judgement:  Intact  Insight:  Present  Psychomotor Activity:  Normal  Concentration:  Concentration: Fair and Attention Span: Fair  Recall:  [employer] of Knowledge:  Fair  Language:  Fair  Akathisia:  No  Handed:  Right  AIMS (if indicated):     Assets:  Communication Skills Desire for Improvement Financial Resources/Insurance Leisure Time Physical Health Talents/Skills Vocational/Educational  ADL's:  Intact  Cognition:  WNL  Sleep:  Number of Hours: 5.75     Treatment Plan Summary: Daily contact with patient to assess and evaluate symptoms and progress in treatment and Medication management  1 Admit for crisis management and stabilization.  2. Medication management to reduce symptoms to baseline and improved the patient's overall level of functioning. Closely monitor the side effects, efficacy and therapeutic response of medication. Continue Seroquel 26m po BID, Cymbalta 364mpo daily. He remains on Clonidine protocol for opiate use.  3. Treat health problem as indicated.  4. Developed treatment plan to decrease the risk of relapse upon discharge and to reduce the need for readmission.  5. Psychosocial education regarding relapse prevention in self-care.  6. Healthcare followup as needed for medical problems and called consults as indicated.  7. Increase collateral information.  8. Restart home medication where appropriate  9. Encouraged to participate and verbalize into group milieu therapy.    TaNanci PinaFNP 01/13/2018, 3:17 PM    ..Agree with NP Progress Note

## 2018-01-14 DIAGNOSIS — F332 Major depressive disorder, recurrent severe without psychotic features: Secondary | ICD-10-CM

## 2018-01-14 LAB — HIV ANTIBODY (ROUTINE TESTING W REFLEX): HIV Screen 4th Generation wRfx: NONREACTIVE

## 2018-01-14 LAB — HEPATITIS C ANTIBODY: HCV Ab: >11 s/co ratio — ABNORMAL HIGH (ref 0.0–0.9)

## 2018-01-14 LAB — HEPATITIS B SURFACE ANTIGEN: Hepatitis B Surface Ag: NEGATIVE

## 2018-01-14 MED ORDER — DULOXETINE HCL 30 MG PO CPEP
30.0000 mg | ORAL_CAPSULE | Freq: Once | ORAL | Status: AC
  Administered 2018-01-14: 30 mg via ORAL
  Filled 2018-01-14: qty 1

## 2018-01-14 MED ORDER — DULOXETINE HCL 60 MG PO CPEP
60.0000 mg | ORAL_CAPSULE | Freq: Every day | ORAL | Status: DC
  Administered 2018-01-15 – 2018-01-17 (×3): 60 mg via ORAL
  Filled 2018-01-14 (×2): qty 1
  Filled 2018-01-14: qty 7
  Filled 2018-01-14 (×2): qty 1

## 2018-01-14 MED ORDER — QUETIAPINE FUMARATE 25 MG PO TABS
25.0000 mg | ORAL_TABLET | Freq: Two times a day (BID) | ORAL | Status: DC | PRN
  Administered 2018-01-14 – 2018-01-16 (×2): 25 mg via ORAL
  Filled 2018-01-14: qty 10
  Filled 2018-01-14 (×2): qty 1

## 2018-01-14 NOTE — Progress Notes (Signed)
DAR NOTE: Pt present with calm affect and bright mood in the unit. Pt has been isolating himself and has been bed most of the time. Pt stated that he does not have energy to do anything, does not feel like getting out of bed. Pt denies physical pain, took all his meds as scheduled. Pt reports had a good night sleep, good appetite, normal energy, and good concentration. Pt rate depression at 2, hopeless ness at 4, and anxiety at 8. Pt's safety ensured with 15 minute and environmental checks. Pt currently denies SI/HI and A/V hallucinations. Pt verbally agrees to seek staff if SI/HI or A/VH occurs and to consult with staff before acting on these thoughts. Will continue POC.

## 2018-01-14 NOTE — Progress Notes (Signed)
Patient did not attend wrap up group. 

## 2018-01-14 NOTE — Progress Notes (Signed)
Patient has been observed up in the dayroom watching tv for a brief time before returning to his room to lie down. He has been in his room resting the majority of the night. He was compliant with medication and has concerns about his blood pressure increasing. Fluids given and encouraged. Support given and safety maintained on unit with 15 min checks.

## 2018-01-14 NOTE — [facility] Group Notes (Signed)
[facility] Group Notes:  (Nursing/MHT/Case Management/Adjunct)  Date:  01/14/2018  Time:  4:37 PM  Type of Therapy:  Psychoeducational Skills  Participation Level:  Did Not Attend  Participation Quality:  DID NOT ATTEND  Affect:  DID NOT ATTEND  Cognitive:  DID NOT ATTEND  Insight:  None  Engagement in Group:  DID NOT ATTEND  Modes of Intervention:  DID NOT ATTEND  Summary of Progress/Problems: Pt did not attend patient self inventory group.   Bethann PunchesJane O Kinnick Maus 01/14/2018, 4:37 PM

## 2018-01-14 NOTE — Progress Notes (Addendum)
[facility] MD Progress Note  01/14/2018 2:37 PM SOU NOHR  MRN:  [id number]   Subjective: Patient reports that he is doing a little better today.  He denies any SI/HI/AVH and he contracts for safety.  Patient states that he feels that he has no energy and improve his pulled down some.  Patient is asking for some changes to be done because he does not feel like getting out of bed still.  Patient also states that he feels that the hallucinations have went away mainly because he has been off of the meth and the cocaine for several days now.  Patient has numerous medication questions and is willing to ensure that he just feels as good as he possibly can before he is discharged.  He states he is still is planning on going to [employer] and that he has a court date on Wednesday that he needs to go on it take care of before he goes to [employer].  Objective:Patient's chart and findings reviewed and discussed with treatment team.  Patient presents in his room after lunch lying down.  He states that he is tired and is curious if it is some of his medications.  Reviewed medications and will change in Seroquel 25 mg twice daily to as needed and will increase his Cymbalta to 60 mg a day.  Patient is questioned about his elevated blood pressures and he states that he has never been diagnosed with any hypertension.  We discussed the possibility of been associated with his withdrawal symptoms, even though the one is noticeable or [facility], and patient states that he would prefer to wait and avoid any extra medication until after his detox is completed.  Informed patient and nursing staff to monitor blood patient's blood pressure closer and will start a medication for hypertension if necessary.  Principal Problem: MDD (major depressive disorder), severe (HCC) Diagnosis:   Patient Active Problem List   Diagnosis Date Noted  . MDD (major depressive disorder), severe (HCC) [F32.2] 01/12/2018  . Alcohol dependence with  acute alcoholic intoxication (HCC) [F10.229] 05/04/2011   Total Time spent with patient: 30 minutes  Past Psychiatric History: See H&P  Past Medical History:  Past Medical History:  Diagnosis Date  . Anxiety   . Depression   . Heroin abuse (HCC)   . Methamphetamine addiction (HCC)    History reviewed. No pertinent surgical history. Family History: History reviewed. No pertinent family history. Family Psychiatric  History: See H&P Social History:  Social History   Substance and Sexual Activity  Alcohol Use Not Currently  . Alcohol/week: 126.0 standard drinks  . Types: 126 Cans of beer per week  . Frequency: Never     Social History   Substance and Sexual Activity  Drug Use Yes  . Types: IV, Methamphetamines   Comment: heroin, meth, and morphine use every day    Social History   Socioeconomic History  . Marital status: Single    Spouse name: Not on file  . Number of children: Not on file  . Years of education: Not on file  . Highest education level: Not on file  Occupational History  . Not on file  Social Needs  . Financial resource strain: Not on file  . Food insecurity:    Worry: Not on file    Inability: Not on file  . Transportation needs:    Medical: Not on file    Non-medical: Not on file  Tobacco Use  . Smoking status: Current  Every Day Smoker    Packs/day: 1.00    Years: 15.00    Pack years: 15.00    Types: Cigarettes  . Smokeless tobacco: Current User  Substance and Sexual Activity  . Alcohol use: Not Currently    Alcohol/week: 126.0 standard drinks    Types: 126 Cans of beer per week    Frequency: Never  . Drug use: Yes    Types: IV, Methamphetamines    Comment: heroin, meth, and morphine use every day  . Sexual activity: Not on file  Lifestyle  . Physical activity:    Days per week: Not on file    Minutes per session: Not on file  . Stress: Not on file  Relationships  . Social connections:    Talks on phone: Not on file    Gets  together: Not on file    Attends religious service: Not on file    Active member of club or organization: Not on file    Attends meetings of clubs or organizations: Not on file    Relationship status: Not on file  Other Topics Concern  . Not on file  Social History Narrative  . Not on file   Additional Social History:                         Sleep: Good  Appetite:  Good  Current Medications: Current Facility-Administered Medications  Medication Dose Route Frequency Provider Last Rate Last Dose  . acetaminophen (TYLENOL) tablet 650 mg  650 mg Oral Q6H PRN Denzil Magnuson, NP      . alum & mag hydroxide-simeth (MAALOX/MYLANTA) 200-200-20 MG/5ML suspension 30 mL  30 mL Oral Q4H PRN Denzil Magnuson, NP      . cloNIDine (CATAPRES) tablet 0.1 mg  0.1 mg Oral BH-qamhs Nwoko, Agnes I, NP       Followed by  . [START ON 01/17/2018] cloNIDine (CATAPRES) tablet 0.1 mg  0.1 mg Oral QAC breakfast Nwoko, Agnes I, NP      . dicyclomine (BENTYL) tablet 20 mg  20 mg Oral Q6H PRN Armandina Stammer I, NP      . Melene Muller ON 01/15/2018] DULoxetine (CYMBALTA) DR capsule 60 mg  60 mg Oral Daily Money, Gerlene Burdock, FNP      . hydrOXYzine (ATARAX/VISTARIL) tablet 25 mg  25 mg Oral Q6H PRN Armandina Stammer I, NP   25 mg at 01/13/18 2141  . loperamide (IMODIUM) capsule 2-4 mg  2-4 mg Oral PRN Armandina Stammer I, NP      . magnesium hydroxide (MILK OF MAGNESIA) suspension 30 mL  30 mL Oral Daily PRN Denzil Magnuson, NP      . methocarbamol (ROBAXIN) tablet 500 mg  500 mg Oral Q8H PRN Armandina Stammer I, NP   500 mg at 01/12/18 1848  . naproxen (NAPROSYN) tablet 500 mg  500 mg Oral BID PRN Armandina Stammer I, NP   500 mg at 01/12/18 1848  . ondansetron (ZOFRAN-ODT) disintegrating tablet 4 mg  4 mg Oral Q6H PRN Armandina Stammer I, NP      . pneumococcal 23 valent vaccine (PNU-IMMUNE) injection 0.5 mL  0.5 mL Intramuscular Tomorrow-1000 Cobos, Fernando A, MD      . QUEtiapine (SEROQUEL) tablet 25 mg  25 mg Oral BID PRN Money, Gerlene Burdock, FNP        Lab Results:  Results for orders placed or performed during the hospital encounter of 01/12/18 (from the past 48 hour(s))  TSH     Status: None   Collection Time: 01/13/18  6:25 PM  Result Value Ref Range   TSH 0.798 0.350 - 4.500 uIU/mL    Comment: Performed by a 3rd Generation assay with a functional sensitivity of <=0.01 uIU/mL. Performed at [facility], [address] [place], [place] [address]   Hemoglobin A1c     Status: None   Collection Time: 01/13/18  6:25 PM  Result Value Ref Range   Hgb A1c MFr Bld 5.6 4.8 - 5.6 %    Comment: (NOTE) Pre diabetes:          5.7%-6.4% Diabetes:              >6.4% Glycemic control for   <7.0% adults with diabetes    Mean Plasma Glucose 114.02 mg/dL    Comment: Performed at [facility], [address] [place], [place] [address]  Hepatitis B surface antigen     Status: None   Collection Time: 01/13/18  6:25 PM  Result Value Ref Range   Hepatitis B Surface Ag Negative Negative    Comment: (NOTE) Performed At: [facility] [address] [place], [place] [id number] Jolene SchimkeNagendra Sanjai MD WG:[id number]:[phone]   Lipid panel     Status: Abnormal   Collection Time: 01/13/18  6:31 PM  Result Value Ref Range   Cholesterol 179 0 - 200 mg/dL   Triglycerides 784344 (H) <150 mg/dL   HDL 40 (L) >69>40 mg/dL   Total CHOL/HDL Ratio 4.5 RATIO   VLDL 69 (H) 0 - 40 mg/dL   LDL Cholesterol 70 0 - 99 mg/dL    Comment:        Total Cholesterol/HDL:CHD Risk Coronary Heart Disease Risk Table                     Men   Women  1/2 Average Risk   3.4   3.3  Average Risk       5.0   4.4  2 X Average Risk   9.6   7.1  3 X Average Risk  23.4   11.0        Use the calculated Patient Ratio above and the CHD Risk Table to determine the patient's CHD Risk.        ATP III CLASSIFICATION (LDL):  <100     mg/dL   Optimal  [id number]  mg/dL   Near or Above                    Optimal  130-159  mg/dL   Borderline  [id number]  mg/dL    High  >010>190     mg/dL   Very High Performed at [facility], [address] [place], [place] [address]   HIV antibody     Status: None   Collection Time: 01/13/18  6:31 PM  Result Value Ref Range   HIV Screen 4th Generation wRfx Non Reactive Non Reactive    Comment: (NOTE) Performed At: [facility] [address] [place], [place] [id number] Jolene SchimkeNagendra Sanjai MD QV:[id number]:[phone]   Hepatitis C antibody     Status: Abnormal   Collection Time: 01/13/18  6:31 PM  Result Value Ref Range   HCV Ab >11.0 (H) 0.0 - 0.9 s/co ratio    Comment: (NOTE)  Negative:     < 0.8                             Indeterminate: 0.8 - 0.9                                  Positive:     > 0.9 The CDC recommends that a positive HCV antibody result be followed up with a HCV Nucleic Acid Amplification test (664403). Performed At: [facility] [address] [place], [place] [id number] Jolene Schimke MD OV:[id number]     Blood Alcohol level:  Lab Results  Component Value Date   ETH <10 01/11/2018   ETH 254 (H) 05/03/2011    Metabolic Disorder Labs: Lab Results  Component Value Date   HGBA1C 5.6 01/13/2018   MPG 114.02 01/13/2018   No results found for: PROLACTIN Lab Results  Component Value Date   CHOL 179 01/13/2018   TRIG 344 (H) 01/13/2018   HDL 40 (L) 01/13/2018   CHOLHDL 4.5 01/13/2018   VLDL 69 (H) 01/13/2018   LDLCALC 70 01/13/2018    Physical Findings: AIMS: Facial and Oral Movements Muscles of Facial Expression: None, normal Lips and Perioral Area: None, normal Jaw: None, normal Tongue: None, normal,Extremity Movements Upper (arms, wrists, hands, fingers): None, normal Lower (legs, knees, ankles, toes): None, normal, Trunk Movements Neck, shoulders, hips: None, normal, Overall Severity Severity of abnormal movements (highest score from questions above): None, normal Incapacitation due to abnormal movements: None,  normal Patient's awareness of abnormal movements (rate only patient's report): No Awareness, Dental Status Current problems with teeth and/or dentures?: No Does patient usually wear dentures?: No  CIWA:    COWS:  COWS Total Score: 1  Musculoskeletal: Strength & Muscle Tone: within normal limits Gait & Station: normal Patient leans: N/A  Psychiatric Specialty Exam: Physical Exam  Nursing note and vitals reviewed. Constitutional: He is oriented to person, place, and time. He appears well-developed and well-nourished.  Respiratory: Effort normal.  Musculoskeletal: Normal range of motion.  Neurological: He is alert and oriented to person, place, and time.  Skin: Skin is warm.    Review of Systems  Constitutional: Negative.   HENT: Negative.   Eyes: Negative.   Respiratory: Negative.   Cardiovascular: Negative.   Gastrointestinal: Negative.   Genitourinary: Negative.   Musculoskeletal: Negative.   Skin: Negative.   Neurological: Negative.   Endo/Heme/Allergies: Negative.   Psychiatric/Behavioral: Positive for depression and substance abuse. The patient is nervous/anxious.     Blood pressure (!) 136/109, pulse (!) 102, temperature 98.7 F (37.1 C), resp. rate 18, height 5\' 3"  (1.6 m), weight 50.3 kg, SpO2 100 %.Body mass index is 19.66 kg/m.  General Appearance: Casual  Eye Contact:  Good  Speech:  Clear and Coherent and Normal Rate  Volume:  Normal  Mood:  Depressed  Affect:  Flat  Thought Process:  Goal Directed and Descriptions of Associations: Intact  Orientation:  Full (Time, Place, and Person)  Thought Content:  WDL  Suicidal Thoughts:  No  Homicidal Thoughts:  No  Memory:  Immediate;   Good Recent;   Good Remote;   Good  Judgement:  Fair  Insight:  Good  Psychomotor Activity:  Normal  Concentration:  Concentration: Good and Attention Span: Good  Recall:  Good  Fund of Knowledge:  Good  Language:  Good  Akathisia:  No  Handed:  Right  AIMS (if indicated):      Assets:  Communication Skills Desire for Improvement Financial Resources/Insurance Housing Leisure Time Social Support Transportation  ADL's:  Intact  Cognition:  WNL  Sleep:  Number of Hours: 6.25   Problems Addressed: MDD severe  Treatment Plan Summary: Daily contact with patient to assess and evaluate symptoms and progress in treatment, Medication management and Plan is to: -Increase Cymbalta to 60 mg p.o. daily for mood stability -Continue clonidine detox protocol -Continue Vistaril 25 mg every 6 as needed for anxiety -Continue Seroquel 25 mg in p.o. twice daily as needed for agitation -Encourage group therapy participation  Maryfrances Bunnell, FNP 01/14/2018, 2:37 PM    ..Agree with NP Progress Note

## 2018-01-14 NOTE — [facility] Group Notes (Signed)
[facility] Group Notes: (Clinical Social Work)   01/14/2018      Type of Therapy:  Group Therapy   Participation Level:  Did Not Attend despite MHT prompting   Emmerich Cryer Grossman-Orr, LCSW 01/14/2018, 1:12 PM     

## 2018-01-14 NOTE — Plan of Care (Signed)
  Problem: Coping: Goal: Ability to verbalize frustrations and anger appropriately will improve Outcome: Progressing   Problem: Safety: Goal: Periods of time without injury will increase Outcome: Progressing   Problem: Activity: Goal: Interest or engagement in activities will improve Outcome: Not Progressing   

## 2018-01-15 MED ORDER — LISINOPRIL 5 MG PO TABS
5.0000 mg | ORAL_TABLET | Freq: Every day | ORAL | Status: DC
  Administered 2018-01-15 – 2018-01-17 (×3): 5 mg via ORAL
  Filled 2018-01-15: qty 7
  Filled 2018-01-15 (×5): qty 1

## 2018-01-15 NOTE — Progress Notes (Addendum)
[facility] MD Progress Note  01/15/2018 1:42 PM KEVEON AMSLER  MRN:  [id number]   Subjective: patient reports he is feeling significantly better than he did prior to admission. He denies suicidal thoughts. He denies medication side effects - on Cymbalta. No current WDL symptoms.  Objective: I have reviewed chart notes and have met with patient. 42 year old male, presented to the hospital on 8/7 , reporting depression, suicidal ideations, experiencing auditory hallucinations. Reported history of opiate and methamphetamine dependencies, mainly the latter. At this time patient reports partial but significant improvement compared to admission. States he is feeling better, less depressed, more hopeful, and denies current suicidal ideations, and states hallucinations have completely resolved . Reports improved sleep, improved appetite.  No current significant symptoms of opiate WDL- denies nausea or vomiting, denies diarrhea, denies cramping or muscular aches, no piloerection, no repetitive yawning or lacrimation Denies medication side effects- on Cymbalta. States he feels this medication is helping " a lot" . He is future oriented, and wanting to go to a residential program [facility] ). Labs reviewed- Hep C (+) , reviewed this finding with patient, including availability of potentially curative treatments and importance of outpatient follow up to monitor and treat said condition.  Principal Problem: MDD (major depressive disorder), severe ([place]) Diagnosis:   Patient Active Problem List   Diagnosis Date Noted  . MDD (major depressive disorder), severe ([place]) [F32.2] 01/12/2018  . Alcohol dependence with acute alcoholic intoxication ([place]) [F10.229] 05/04/2011   Total Time spent with patient: 20 minutes  Past Psychiatric History: See H&P  Past Medical History:  Past Medical History:  Diagnosis Date  . Anxiety   . Depression   . Heroin abuse ([place])   . 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 No pertinent surgical history. Family History: History reviewed. No pertinent family history. Family Psychiatric  History: See H&P Social History:  Social History   Substance and Sexual Activity  Alcohol Use Not Currently  . Alcohol/week: 126.0 standard drinks  . Types: 126 Cans of beer per week  . Frequency: Never     Social History   Substance and Sexual Activity  Drug Use Yes  . Types: IV, Methamphetamines   Comment: heroin, meth, and morphine use every day    Social History   Socioeconomic History  . Marital status: Single    Spouse name: Not on file  . Number of children: Not on file  . Years of education: Not on file  . Highest education level: Not on file  Occupational History  . Not on file  Social Needs  . Financial resource strain: Not on file  . Food insecurity:    Worry: Not on file    Inability: Not on file  . Transportation needs:    Medical: Not on file    Non-medical: Not on file  Tobacco Use  . Smoking status: Current Every Day Smoker    Packs/day: 1.00    Years: 15.00    Pack years: 15.00    Types: Cigarettes  . Smokeless tobacco: Current User  Substance and Sexual Activity  . Alcohol use: Not Currently    Alcohol/week: 126.0 standard drinks    Types: 126 Cans of beer per week    Frequency: Never  . Drug use: Yes    Types: IV, Methamphetamines    Comment: heroin, meth, and morphine use every day  . Sexual activity: Not on file  Lifestyle  . Physical activity:  Days per week: Not on file    Minutes per session: Not on file  . Stress: Not on file  Relationships  . Social connections:    Talks on phone: Not on file    Gets together: Not on file    Attends religious service: Not on file    Active member of club or organization: Not on file    Attends meetings of clubs or organizations: Not on file    Relationship status: Not on file  Other Topics Concern  . Not on file  Social History Narrative  . Not on file    Additional Social History:   Sleep: Good  Appetite:  Good  Current Medications: Current Facility-Administered Medications  Medication Dose Route Frequency Provider Last Rate Last Dose  . acetaminophen (TYLENOL) tablet 650 mg  650 mg Oral Q6H PRN Mordecai Maes, NP      . alum & mag hydroxide-simeth (MAALOX/MYLANTA) 200-200-20 MG/5ML suspension 30 mL  30 mL Oral Q4H PRN Mordecai Maes, NP      . cloNIDine (CATAPRES) tablet 0.1 mg  0.1 mg Oral BH-qamhs Nwoko, Agnes I, NP   0.1 mg at 01/15/18 0804   Followed by  . [START ON 01/17/2018] cloNIDine (CATAPRES) tablet 0.1 mg  0.1 mg Oral QAC breakfast Nwoko, Agnes I, NP      . dicyclomine (BENTYL) tablet 20 mg  20 mg Oral Q6H PRN Lindell Spar I, NP      . DULoxetine (CYMBALTA) DR capsule 60 mg  60 mg Oral Daily Money, Lowry Ram, FNP   60 mg at 01/15/18 0804  . hydrOXYzine (ATARAX/VISTARIL) tablet 25 mg  25 mg Oral Q6H PRN Lindell Spar I, NP   25 mg at 01/15/18 1037  . loperamide (IMODIUM) capsule 2-4 mg  2-4 mg Oral PRN Lindell Spar I, NP      . magnesium hydroxide (MILK OF MAGNESIA) suspension 30 mL  30 mL Oral Daily PRN Mordecai Maes, NP      . methocarbamol (ROBAXIN) tablet 500 mg  500 mg Oral Q8H PRN Lindell Spar I, NP   500 mg at 01/12/18 1848  . naproxen (NAPROSYN) tablet 500 mg  500 mg Oral BID PRN Lindell Spar I, NP   500 mg at 01/12/18 1848  . ondansetron (ZOFRAN-ODT) disintegrating tablet 4 mg  4 mg Oral Q6H PRN Lindell Spar I, NP      . pneumococcal 23 valent vaccine (PNU-IMMUNE) injection 0.5 mL  0.5 mL Intramuscular Tomorrow-1000 Cobos, Fernando A, MD      . QUEtiapine (SEROQUEL) tablet 25 mg  25 mg Oral BID PRN Money, Lowry Ram, FNP   25 mg at 01/14/18 1616    Lab Results:  Results for orders placed or performed during the hospital encounter of 01/12/18 (from the past 48 hour(s))  TSH     Status: None   Collection Time: 01/13/18  6:25 PM  Result Value Ref Range   TSH 0.798 0.350 - 4.500 uIU/mL    Comment: Performed by a  3rd Generation assay with a functional sensitivity of <=0.01 uIU/mL. Performed at [facility], [place] [address] [place], [place] [address]   Hemoglobin A1c     Status: None   Collection Time: 01/13/18  6:25 PM  Result Value Ref Range   Hgb A1c MFr Bld 5.6 4.8 - 5.6 %    Comment: (NOTE) Pre diabetes:          5.7%-6.4% Diabetes:              >  6.4% Glycemic control for   <7.0% adults with diabetes    Mean Plasma Glucose 114.02 mg/dL    Comment: Performed at [facility] [address], [place], [place] [address]  Hepatitis B surface antigen     Status: None   Collection Time: 01/13/18  6:25 PM  Result Value Ref Range   Hepatitis B Surface Ag Negative Negative    Comment: (NOTE) Performed At: [facility] [place], [place] [id number] Rush Farmer MD ID:[id number]   Lipid panel     Status: Abnormal   Collection Time: 01/13/18  6:31 PM  Result Value Ref Range   Cholesterol 179 0 - 200 mg/dL   Triglycerides 344 (H) <150 mg/dL   HDL 40 (L) >40 mg/dL   Total CHOL/HDL Ratio 4.5 RATIO   VLDL 69 (H) 0 - 40 mg/dL   LDL Cholesterol 70 0 - 99 mg/dL    Comment:        Total Cholesterol/HDL:CHD Risk Coronary Heart Disease Risk Table                     Men   Women  1/2 Average Risk   3.4   3.3  Average Risk       5.0   4.4  2 X Average Risk   9.6   7.1  3 X Average Risk  23.4   11.0        Use the calculated Patient Ratio above and the CHD Risk Table to determine the patient's CHD Risk.        ATP III CLASSIFICATION (LDL):  <100     mg/dL   Optimal  100-129  mg/dL   Near or Above                    Optimal  130-159  mg/dL   Borderline  160-189  mg/dL   High  >190     mg/dL   Very High Performed at [facility] [address], [place],  [address]   HIV antibody     Status: None   Collection Time: 01/13/18  6:31 PM  Result Value Ref Range   HIV Screen 4th Generation wRfx Non Reactive Non Reactive     Comment: (NOTE) Performed At: [facility] [place], [place] [id number] Rush Farmer MD JK:[id number]   Hepatitis C antibody     Status: Abnormal   Collection Time: 01/13/18  6:31 PM  Result Value Ref Range   HCV Ab >11.0 (H) 0.0 - 0.9 s/co ratio    Comment: (NOTE)                                  Negative:     < 0.8                             Indeterminate: 0.8 - 0.9                                  Positive:     > 0.9 The CDC recommends that a positive HCV antibody result be followed up with a HCV Nucleic Acid Amplification test (099833). Performed At: [facility] [place], [place] [id number] Rush Farmer MD BH:[id number]  Blood Alcohol level:  Lab Results  Component Value Date   ETH <10 01/11/2018   ETH 254 (H) 29/47/6546    Metabolic Disorder Labs: Lab Results  Component Value Date   HGBA1C 5.6 01/13/2018   MPG 114.02 01/13/2018   No results found for: PROLACTIN Lab Results  Component Value Date   CHOL 179 01/13/2018   TRIG 344 (H) 01/13/2018   HDL 40 (L) 01/13/2018   CHOLHDL 4.5 01/13/2018   VLDL 69 (H) 01/13/2018   LDLCALC 70 01/13/2018    Physical Findings: AIMS: Facial and Oral Movements Muscles of Facial Expression: None, normal Lips and Perioral Area: None, normal Jaw: None, normal Tongue: None, normal,Extremity Movements Upper (arms, wrists, hands, fingers): None, normal Lower (legs, knees, ankles, toes): None, normal, Trunk Movements Neck, shoulders, hips: None, normal, Overall Severity Severity of abnormal movements (highest score from questions above): None, normal Incapacitation due to abnormal movements: None, normal Patient's awareness of abnormal movements (rate only patient's report): No Awareness, Dental Status Current problems with teeth and/or dentures?: No Does patient usually wear dentures?: No  CIWA:    COWS:  COWS Total Score: 2  Musculoskeletal: Strength & Muscle Tone:  within normal limits Gait & Station: normal Patient leans: N/A  Psychiatric Specialty Exam: Physical Exam  Nursing note and vitals reviewed. Constitutional: He is oriented to person, place, and time. He appears well-developed and well-nourished.  Respiratory: Effort normal.  Musculoskeletal: Normal range of motion.  Neurological: He is alert and oriented to person, place, and time.  Skin: Skin is warm.    Review of Systems  Constitutional: Negative.   HENT: Negative.   Eyes: Negative.   Respiratory: Negative.   Cardiovascular: Negative.   Gastrointestinal: Negative.   Genitourinary: Negative.   Musculoskeletal: Negative.   Skin: Negative.   Neurological: Negative.   Endo/Heme/Allergies: Negative.   Psychiatric/Behavioral: Positive for depression and substance abuse. The patient is nervous/anxious.   denies headache, no chest pain, no RUQ pain, no acholia or choluria  Blood pressure (!) 144/103, pulse 97, temperature 98.8 F (37.1 C), temperature source Oral, resp. rate 18, height '5\' 3"'$  (1.6 m), weight 50.3 kg, SpO2 100 %.Body mass index is 19.66 kg/m.  General Appearance: Casual and improving grooming   Eye Contact:  Good  Speech:  Normal Rate  Volume:  Normal  Mood:  improving mood, reports he is feeling better   Affect:  improved, more reactive  Thought Process:  Linear and Descriptions of Associations: Intact  Orientation:  Full (Time, Place, and Person)  Thought Content:  no hallucinations, no delusions, not internally preoccupied   Suicidal Thoughts:  No denies suicidal or self injurious ideations,no homicidal ideations  Homicidal Thoughts:  No  Memory:  recent and remote grossly intact   Judgement:  Other:  improving   Insight:  improving   Psychomotor Activity:  Normal  Concentration:  Concentration: Good and Attention Span: Good  Recall:  Good  Fund of Knowledge:  Good  Language:  Good  Akathisia:  No  Handed:  Right  AIMS (if indicated):     Assets:   Communication Skills Desire for Improvement Financial Resources/Insurance Housing Leisure Time Social Support Transportation  ADL's:  Intact  Cognition:  WNL  Sleep:  Number of Hours: 6.75   Assessment - patient is a 42 year old male, who presented to hospital due to depression, auditory hallucinations,suicidal ideations, and opiate/methamphetamine (IV) abuse . Currently presents with significantly improved mood and range of affect , and psychotic symptoms have  resolved . Currently future oriented and wanting to go to a long term residential setting [facility] ) at discharge.  Hep C (+)- new diagnoses for patient - no associated symptoms and LFTS WNL- have discussed importance of following up for monitoring and treatment .  Treatment Plan Summary: Daily contact with patient to assess and evaluate symptoms and progress in treatment, Medication management and Plan is to:  Encourage group and milieu participation to work on Radiographer, therapeutic  Encourage efforts to work on sobriety and relapse prevention Treatment Team working on disposition planning options  Continue Cymbalta 60 mgrs QDAY for depression, anxiety  Discontinue  Clonidine detox protocol as no symptoms of WDL Continue Vistaril 25 mg Q 6 hours PRN for  anxiety Continue Seroquel 25 mg BID PRN for anxiety Have reviewed case with Hospitalist consultant- start Lisinopril 5 mgrs for HTN   Jenne Campus, MD 01/15/2018, 1:42 PM   Patient ID: Beatrice Lecher, male   DOB: March 16, 1976, 42 y.o.   MRN: [id number]

## 2018-01-15 NOTE — Progress Notes (Signed)
Patient did not attend the evening speaker AA meeting. Pt was notified that group was beginning but remained in bed.   

## 2018-01-15 NOTE — Progress Notes (Signed)
Patient ID: [name], male   DOB: 27-Sep-1975, 42 y.o.   MRN: [id number]  D: Patient reports his sleep quality last night as fair, reports a fair appetite, reports a normal energy level, and a good concentration level.  Pt rates his depression as 2 (10 being the highest), rates his hopelessness level as 1 (10 being the highest), and rates his anxiety level today as 1 (10 being the highest level).  Pt denies SI/HI/AVH, reports that his goal for today is to go home.  Pt is cooperative with care and states that he is here "by mistake", and states that he plans on being discharged tomorrow. Pt denies SI/HI/AVH, and denies being in any physical pain.  A: Pt medicated with all of her meds as scheduled, and  Pt is being maintained on Q15 minute checks for safety.  R: Will continue to monitor on Q15 minute checks

## 2018-01-15 NOTE — [facility] Group Notes (Signed)
[facility] Group Notes: (Clinical Social Work)   01/15/2018      Type of Therapy:  Group Therapy   Participation Level:  Did Not Attend despite MHT prompting   Ambrose MantleMareida Grossman-Orr, LCSW 01/15/2018, 1:36 PM

## 2018-01-15 NOTE — [facility] Group Notes (Signed)
Adult Psychoeducational Group Note  Date:  01/15/2018 Time:  1:00 PM  Group Topic/Focus: Life Skills Facilitator: Patty D., RN  Participation Level:  Did Not Attend  Additional Comments:  Patient was with the doctor during group.  Adam Riley 01/15/2018, 2:00 PM

## 2018-01-15 NOTE — Progress Notes (Signed)
Writer spoke with patient 1:1 at medication window when he requested medication for anxiety. Writer inquired if he planned on attending AA group and he reported that he was going back to his room and rest. He reported that he was glad that blood pressure medication was started up today because he was concerned about his high blood pressures. He spends little time in the dayroom unless its snack time or a tv show he wants to watch, other times he is isolative to is room. Support given and safety maintained on unit with 15 min checks.

## 2018-01-16 DIAGNOSIS — F322 Major depressive disorder, single episode, severe without psychotic features: Secondary | ICD-10-CM

## 2018-01-16 MED ORDER — CLONIDINE HCL 0.1 MG PO TABS
ORAL_TABLET | ORAL | Status: AC
  Filled 2018-01-16: qty 2

## 2018-01-16 MED ORDER — CLONIDINE HCL 0.2 MG PO TABS
0.2000 mg | ORAL_TABLET | Freq: Once | ORAL | Status: AC
  Administered 2018-01-16: 0.2 mg via ORAL
  Filled 2018-01-16: qty 1

## 2018-01-16 NOTE — Progress Notes (Addendum)
D. Pt presents with an anxious affect/mood- observed interacting appropriately in milieu Per pt's self inventory, pt rates his depression, hopelessness and anxiety a 0/0/2, respectively. Pt writes that his most important goal to work on today is "going home". Pt currently denies SI/HI and A/V hallucinations.  A. Labs and vitals monitored. Pt compliant with medications. Pt supported emotionally and encouraged to express concerns and ask questions.   R. Pt remains safe with 15 minute checks. Will continue POC.

## 2018-01-16 NOTE — Progress Notes (Addendum)
[facility] MD Progress Note  01/16/2018 12:41 PM Adam Riley  MRN:  [id number]   Subjective: Adam Riley is a 42 year old Caucasian male who was admitted to [facility] after having suicidal ideation in the presence of methamphetamine use. Patient reports he is feeling significantly better than he did prior to admission. He denies suicidal thoughts. He denies medication side effects - on Cymbalta. No current WDL symptoms.  Objective: I have reviewed chart notes and have met with patient. 42 year old male, presented to the hospital on 8/7 , reporting depression, suicidal ideations, experiencing auditory hallucinations. Reported history of opiate and methamphetamine dependencies, mainly the latter. Pt denies any alcohol abuse.  At this time patient reports partial but significant improvement compared to admission. States he is feeling better, less depressed, more hopeful, and denies current suicidal ideations, and states hallucinations have completely resolved . Reports improved sleep, improved appetite.  Pt denies any type of withdrawal symptoms and stated he slept well last night. Pt's BP has been elevated today in the 150's/110's for which he received Catapres 0.2 mg along with his daily lisinopril. BP recheck at 1130 shows 127/93. Pt is anxious to be discharged on 01-17-18 because he has a court date on 01-18-18 and he wants to resolve this prior to going to [facility].  Denies medication side effects on Cymbalta. States he feels this medication is helping " a lot" . He is future oriented, and stated he has been accepted to [facility] and is looking forward to the long term program, the work, making new friends and being able to take part in the [place] and AA programs that are offered there. Pt stated he does not want to return to drug use and is well aware of how powerful methamphetamines are to stay away from.   Principal Problem: MDD (major depressive disorder), severe ([place]) Diagnosis:   Patient Active  Problem List   Diagnosis Date Noted  . MDD (major depressive disorder), severe ([place]) [F32.2] 01/12/2018  . Alcohol dependence with acute alcoholic intoxication ([place]) [F10.229] 05/04/2011   Total Time spent with patient: 20 minutes  Past Psychiatric History: See H&P  Past Medical History:  Past Medical History:  Diagnosis Date  . Anxiety   . Depression   . Heroin abuse ([place])   . Methamphetamine addiction ([place])    History reviewed. No pertinent surgical history. Family History: History reviewed. No pertinent family history. Family Psychiatric  History: See H&P Social History:  Social History   Substance and Sexual Activity  Alcohol Use Not Currently  . Alcohol/week: 126.0 standard drinks  . Types: 126 Cans of beer per week  . Frequency: Never     Social History   Substance and Sexual Activity  Drug Use Yes  . Types: IV, Methamphetamines   Comment: heroin, meth, and morphine use every day    Social History   Socioeconomic History  . Marital status: Single    Spouse name: Not on file  . Number of children: Not on file  . Years of education: Not on file  . Highest education level: Not on file  Occupational History  . Not on file  Social Needs  . Financial resource strain: Not on file  . Food insecurity:    Worry: Not on file    Inability: Not on file  . Transportation needs:    Medical: Not on file    Non-medical: Not on file  Tobacco Use  . Smoking status: Current Every Day  Smoker    Packs/day: 1.00    Years: 15.00    Pack years: 15.00    Types: Cigarettes  . Smokeless tobacco: Current User  Substance and Sexual Activity  . Alcohol use: Not Currently    Alcohol/week: 126.0 standard drinks    Types: 126 Cans of beer per week    Frequency: Never  . Drug use: Yes    Types: IV, Methamphetamines    Comment: heroin, meth, and morphine use every day  . Sexual activity: Not on file  Lifestyle  . Physical activity:    Days per week: Not on file    Minutes  per session: Not on file  . Stress: Not on file  Relationships  . Social connections:    Talks on phone: Not on file    Gets together: Not on file    Attends religious service: Not on file    Active member of club or organization: Not on file    Attends meetings of clubs or organizations: Not on file    Relationship status: Not on file  Other Topics Concern  . Not on file  Social History Narrative  . Not on file   Additional Social History:   Sleep: Good  Appetite:  Good  Current Medications: Current Facility-Administered Medications  Medication Dose Route Frequency Provider Last Rate Last Dose  . acetaminophen (TYLENOL) tablet 650 mg  650 mg Oral Q6H PRN Mordecai Maes, NP      . alum & mag hydroxide-simeth (MAALOX/MYLANTA) 200-200-20 MG/5ML suspension 30 mL  30 mL Oral Q4H PRN Mordecai Maes, NP      . cloNIDine (CATAPRES) 0.1 MG tablet           . DULoxetine (CYMBALTA) DR capsule 60 mg  60 mg Oral Daily Money, Lowry Ram, FNP   60 mg at 01/16/18 2951  . hydrOXYzine (ATARAX/VISTARIL) tablet 25 mg  25 mg Oral Q6H PRN Lindell Spar I, NP   25 mg at 01/16/18 1027  . lisinopril (PRINIVIL,ZESTRIL) tablet 5 mg  5 mg Oral Daily Cobos, Myer Peer, MD   5 mg at 01/16/18 0808  . loperamide (IMODIUM) capsule 2-4 mg  2-4 mg Oral PRN Lindell Spar I, NP      . magnesium hydroxide (MILK OF MAGNESIA) suspension 30 mL  30 mL Oral Daily PRN Mordecai Maes, NP      . methocarbamol (ROBAXIN) tablet 500 mg  500 mg Oral Q8H PRN Lindell Spar I, NP   500 mg at 01/12/18 1848  . naproxen (NAPROSYN) tablet 500 mg  500 mg Oral BID PRN Lindell Spar I, NP   500 mg at 01/12/18 1848  . ondansetron (ZOFRAN-ODT) disintegrating tablet 4 mg  4 mg Oral Q6H PRN Lindell Spar I, NP      . pneumococcal 23 valent vaccine (PNU-IMMUNE) injection 0.5 mL  0.5 mL Intramuscular Tomorrow-1000 Cobos, Fernando A, MD      . QUEtiapine (SEROQUEL) tablet 25 mg  25 mg Oral BID PRN Money, Lowry Ram, FNP   25 mg at 01/14/18 1616     Lab Results:  No results found for this or any previous visit (from the past 48 hour(s)).  Blood Alcohol level:  Lab Results  Component Value Date   ETH <10 01/11/2018   ETH 254 (H) 88/41/6606    Metabolic Disorder Labs: Lab Results  Component Value Date   HGBA1C 5.6 01/13/2018   MPG 114.02 01/13/2018   No results found for: PROLACTIN Lab Results  Component  Value Date   CHOL 179 01/13/2018   TRIG 344 (H) 01/13/2018   HDL 40 (L) 01/13/2018   CHOLHDL 4.5 01/13/2018   VLDL 69 (H) 01/13/2018   LDLCALC 70 01/13/2018    Physical Findings: AIMS: Facial and Oral Movements Muscles of Facial Expression: None, normal Lips and Perioral Area: None, normal Jaw: None, normal Tongue: None, normal,Extremity Movements Upper (arms, wrists, hands, fingers): None, normal Lower (legs, knees, ankles, toes): None, normal, Trunk Movements Neck, shoulders, hips: None, normal, Overall Severity Severity of abnormal movements (highest score from questions above): None, normal Incapacitation due to abnormal movements: None, normal Patient's awareness of abnormal movements (rate only patient's report): No Awareness, Dental Status Current problems with teeth and/or dentures?: No Does patient usually wear dentures?: No  CIWA:    COWS:  COWS Total Score: 5  Musculoskeletal: Strength & Muscle Tone: within normal limits Gait & Station: normal Patient leans: N/A  Psychiatric Specialty Exam: Physical Exam  Nursing note and vitals reviewed. Constitutional: He is oriented to person, place, and time. He appears well-developed and well-nourished.  Respiratory: Effort normal.  Musculoskeletal: Normal range of motion.  Neurological: He is alert and oriented to person, place, and time.  Skin: Skin is warm.    Review of Systems  Constitutional: Negative.   HENT: Negative.   Eyes: Negative.   Respiratory: Negative.   Cardiovascular: Negative.   Gastrointestinal: Negative.   Genitourinary:  Negative.   Musculoskeletal: Negative.   Skin: Negative.   Neurological: Negative.   Endo/Heme/Allergies: Negative.   Psychiatric/Behavioral: Positive for depression and substance abuse. The patient is nervous/anxious.   denies headache, no chest pain, no RUQ pain, no acholia or choluria  Blood pressure (!) 127/93, pulse 95, temperature 98.7 F (37.1 C), temperature source Oral, resp. rate 18, height _0  (1.6 m), weight 50.3 kg, SpO2 100 %.Body mass index is 19.66 kg/m.  General Appearance: Casual and improving grooming   Eye Contact:  Good  Speech:  Normal Rate  Volume:  Normal  Mood:  improving mood, reports he is feeling better   Affect:  improved, more reactive  Thought Process:  Linear and Descriptions of Associations: Intact  Orientation:  Full (Time, Place, and Person)  Thought Content:  no hallucinations, no delusions, not internally preoccupied   Suicidal Thoughts:  No denies suicidal or self injurious ideations,no homicidal ideations  Homicidal Thoughts:  No  Memory:  recent and remote grossly intact   Judgement:  Other:  improving   Insight:  improving   Psychomotor Activity:  Normal  Concentration:  Concentration: Good and Attention Span: Good  Recall:  Good  Fund of Knowledge:  Good  Language:  Good  Akathisia:  No  Handed:  Right  AIMS (if indicated):     Assets:  Communication Skills Desire for Improvement Financial Resources/Insurance Housing Leisure Time Social Support Transportation  ADL's:  Intact  Cognition:  WNL  Sleep:  Number of Hours: 4   Assessment - patient is a 42 year old male, who presented to hospital due to depression, auditory hallucinations,suicidal ideations, and opiate/methamphetamine (IV) abuse . Currently presents with significantly improved mood and range of affect , and psychotic symptoms have resolved . Currently future oriented and wanting to go to a long term residential setting [facility] ) at discharge.  Hep C (+)- new  diagnoses for patient - no associated symptoms and LFTS WNL- have discussed importance of following up for monitoring and treatment .  Treatment Plan Summary: Daily contact with  patient to assess and evaluate symptoms and progress in treatment, Medication management and Plan is to:  Encourage group and milieu participation to work on Radiographer, therapeutic  Encourage efforts to work on sobriety and relapse prevention Treatment Team working on disposition planning options  Continue Cymbalta 60 mgrs QDAY for depression, anxiety  Discontinue  Clonidine detox protocol as no symptoms of WDL Continue Vistaril 25 mg Q 6 hours PRN for  anxiety Continue Seroquel 25 mg BID PRN for anxiety Have reviewed case with Hospitalist consultant- start Lisinopril 5 mgrs for HTN   Ethelene Hal, NP 01/16/2018, 12:41 PM   Patient ID: Adam Riley, male   DOB: 01-14-1976, 42 y.o.   MRN: [id number] Patient ID: Adam Riley, male   DOB: 1975-11-13, 42 y.o.   MRN: [id number] .Marland KitchenAgree with NP Progress Note

## 2018-01-17 MED ORDER — DULOXETINE HCL 60 MG PO CPEP: 60.0000 mg | ORAL_CAPSULE | Freq: Every day | ORAL | 0 refills | Status: AC

## 2018-01-17 MED ORDER — HYDROXYZINE HCL 25 MG PO TABS: 25.0000 mg | ORAL_TABLET | Freq: Four times a day (QID) | ORAL | 0 refills | Status: AC | PRN

## 2018-01-17 MED ORDER — LISINOPRIL 5 MG PO TABS: 5.0000 mg | ORAL_TABLET | Freq: Every day | ORAL | 0 refills | Status: AC

## 2018-01-17 MED ORDER — QUETIAPINE FUMARATE 25 MG PO TABS: 25.0000 mg | ORAL_TABLET | Freq: Two times a day (BID) | ORAL | 0 refills | Status: AC | PRN

## 2018-01-17 NOTE — Discharge Summary (Signed)
Physician Discharge Summary Note  Patient:  Adam Riley is an 42 y.o., male MRN:  [id number] DOB:  Mar 08, 1976 Patient phone:  [phone] (home)  Patient address:   [address] [place] [address],  Total Time spent with patient: 20 minutes  Date of Admission:  01/12/2018 Date of Discharge: 01/17/2018  Reason for Admission: MDD (major depressive disorder), severe ([facility])   Principal Problem: MDD (major depressive disorder), severe [facility](HCC) Discharge Diagnoses: Patient Active Problem List   Diagnosis Date Noted  . MDD (major depressive disorder), severe ([facility]) [F32.2] 01/12/2018  . Alcohol dependence with acute alcoholic intoxication [facility](HCC) [F10.229] 05/04/2011    Past Psychiatric History: See H&P  Past Medical History:  Past Medical History:  Diagnosis Date  . Anxiety   . Depression   . Heroin abuse ([facility])   . Methamphetamine addiction ([facility])    History reviewed. No pertinent surgical history. Family History: History reviewed. No pertinent family history. Family Psychiatric  History: See H&P Social History:  Social History   Substance and Sexual Activity  Alcohol Use Not Currently  . Alcohol/week: 126.0 standard drinks  . Types: 126 Cans of beer per week  . Frequency: Never     Social History   Substance and Sexual Activity  Drug Use Yes  . Types: IV, Methamphetamines   Comment: heroin, meth, and morphine use every day    Social History   Socioeconomic History  . Marital status: Single    Spouse name: Not on file  . Number of children: Not on file  . Years of education: Not on file  . Highest education level: Not on file  Occupational History  . Not on file  Social Needs  . Financial resource strain: Not on file  . Food insecurity:    Worry: Not on file    Inability: Not on file  . Transportation needs:    Medical: Not on file    Non-medical: Not on file  Tobacco Use  . Smoking status: Current Every Day Smoker    Packs/day: 1.00    Years: 15.00     Pack years: 15.00    Types: Cigarettes  . Smokeless tobacco: Current User  Substance and Sexual Activity  . Alcohol use: Not Currently    Alcohol/week: 126.0 standard drinks    Types: 126 Cans of beer per week    Frequency: Never  . Drug use: Yes    Types: IV, Methamphetamines    Comment: heroin, meth, and morphine use every day  . Sexual activity: Not on file  Lifestyle  . Physical activity:    Days per week: Not on file    Minutes per session: Not on file  . Stress: Not on file  Relationships  . Social connections:    Talks on phone: Not on file    Gets together: Not on file    Attends religious service: Not on file    Active member of club or organization: Not on file    Attends meetings of clubs or organizations: Not on file    Relationship status: Not on file  Other Topics Concern  . Not on file  Social History Narrative  . Not on file    Hospital Course: Adam Riley was admitted for MDD (major depressive disorder), severe ([facility]) , with psychosis and crisis management.  Pt was treated discharged with the medications listed below under Medication List.  Medical problems were identified and treated as needed.  Home medications were restarted  as appropriate.  Improvement was monitored by observation and Adam Riley 's daily report of symptom reduction.  Emotional and mental status was monitored by daily self-inventory reports completed by Adam Riley and clinical staff.         Adam Riley was evaluated by the treatment team for stability and plans for continued recovery upon discharge. Adam Riley 's motivation was an integral factor for scheduling further treatment. Employment, transportation, bed availability, health status, family support, and any pending legal issues were also considered during hospital stay. Pt was offered further treatment options upon discharge including but not limited to Residential, Intensive Outpatient, and Outpatient treatment.  Adam Riley will  follow up with the services as listed below under Follow Up Information.     Upon completion of this admission the patient was both mentally and medically stable for discharge denying suicidal/homicidal ideation, auditory/visual/tactile hallucinations, delusional thoughts and paranoia.  Patient was accepted to [facility] for long term substance abuse treatment and recovery. Patient stated he has a court date on Wednesday and from there will go straight to [employer]. Patient is excited to begin his treatment program and get his life back together again.    Adam Riley responded well to treatment with lisinopril 5 mg, Cymbalta 60 mg, Vistaril 25 mg, and Seroquel 25 mgwithout adverse effects. Initially, pt did complain of side effects with these medications, but this resolved. Pt demonstrated improvement without reported or observed adverse effects to the point of stability appropriate for outpatient management.    Physical Findings: AIMS: Facial and Oral Movements Muscles of Facial Expression: None, normal Lips and Perioral Area: None, normal Jaw: None, normal Tongue: None, normal,Extremity Movements Upper (arms, wrists, hands, fingers): None, normal Lower (legs, knees, ankles, toes): None, normal, Trunk Movements Neck, shoulders, hips: None, normal, Overall Severity Severity of abnormal movements (highest score from questions above): None, normal Incapacitation due to abnormal movements: None, normal Patient's awareness of abnormal movements (rate only patient's report): No Awareness, Dental Status Current problems with teeth and/or dentures?: No Does patient usually wear dentures?: No  CIWA:    COWS:  COWS Total Score: 2  Musculoskeletal: Strength & Muscle Tone: within normal limits Gait & Station: normal Patient leans: N/A  Psychiatric Specialty Exam: See SRA Physical Exam  Constitutional: He is oriented to person, place, and time. He appears well-developed and  well-nourished.  HENT:  Head: Normocephalic.  Respiratory: Effort normal.  Musculoskeletal: Normal range of motion.  Neurological: He is alert and oriented to person, place, and time.  Psychiatric: His speech is normal and behavior is normal. Judgment and thought content normal. His mood appears anxious. Cognition and memory are normal.    Review of Systems  Psychiatric/Behavioral: Negative for depression, hallucinations, memory loss, substance abuse and suicidal ideas. The patient is nervous/anxious. The patient does not have insomnia.   All other systems reviewed and are negative.        Has this patient used any form of tobacco in the last 30 days? (Cigarettes, Smokeless Tobacco, Cigars, and/or Pipes)  N/A  Blood Alcohol level:  Lab Results  Component Value Date   ETH <10 01/11/2018   ETH 254 (H) 05/03/2011    Metabolic Disorder Labs:  Lab Results  Component Value Date   HGBA1C 5.6 01/13/2018   MPG 114.02 01/13/2018   No results found for: PROLACTIN Lab Results  Component Value Date   CHOL 179 01/13/2018   TRIG 344 (H) 01/13/2018  HDL 40 (L) 01/13/2018   CHOLHDL 4.5 01/13/2018   VLDL 69 (H) 01/13/2018   LDLCALC 70 01/13/2018    See Psychiatric Specialty Exam and Suicide Risk Assessment completed by Attending Physician prior to discharge.  Discharge destination:  Home  Is patient on multiple antipsychotic therapies at discharge:  No   Has Patient had three or more failed trials of antipsychotic monotherapy by history:  No  Recommended Plan for Multiple Antipsychotic Therapies: NA  Discharge Instructions    Diet - low sodium heart healthy   Complete by:  As directed    Increase activity slowly   Complete by:  As directed      Allergies as of 01/17/2018   No Known Allergies     Medication List    TAKE these medications     Indication  DULoxetine 60 MG capsule Commonly known as:  CYMBALTA Take 1 capsule (60 mg total) by mouth daily. Start taking on:   01/18/2018  Indication:  Major Depressive Disorder   hydrOXYzine 25 MG tablet Commonly known as:  ATARAX/VISTARIL Take 1 tablet (25 mg total) by mouth every 6 (six) hours as needed for anxiety.  Indication:  Feeling Anxious   lisinopril 5 MG tablet Commonly known as:  PRINIVIL,ZESTRIL Take 1 tablet (5 mg total) by mouth daily. Start taking on:  01/18/2018  Indication:  High Blood Pressure Disorder   QUEtiapine 25 MG tablet Commonly known as:  SEROQUEL Take 1 tablet (25 mg total) by mouth 2 (two) times daily as needed (agitation).  Indication:  Major Depressive Disorder      Follow-up Information    Services, [facility]. Go on 02/08/2018.   Why:  Screening for admission appointment is Wednesday, 02/08/18 at 7:45am. Please be sure to bring your Photo ID, SS card, proof of residency [facility] and any discharge paperwork from this hospitalization.  Contact information: [address] [place] [phone]        [facility] Follow up on 01/19/2018.   Specialty:  Behavioral Health Why:  Appointment for medication management and therapy services is Thursday, 01/19/18 at 8:00am. Please be sure to bring your Photo ID, medications and any discharge paperwork.  Contact informationElpidio Eric: [address] [place] [address] [phone]           Follow-up recommendations:  Activity:  as tolerated Diet:  Heart Healthy  Comments:  Take all medications as prescribed. Keep all follow-up appointments as scheduled.  Do not consume alcohol or use illegal drugs while on prescription medications. Report any adverse effects from your medications to your primary care provider promptly.  In the event of recurrent symptoms or worsening symptoms, call 911, a crisis hotline, or go to the nearest emergency department for evaluation.   Signed: Laveda AbbeLaurie Britton Parks, NP 01/17/2018, 9:59 AM

## 2018-01-17 NOTE — Progress Notes (Signed)
  [facility] Discharge [facility] :  Will you be returning to the same living situation after discharge:  No. Patient reports that he has been accepted to a lomg-term SA program [facility]( [address]).  At discharge, do you have transportation home?: Yes,  bus pass Do you have the ability to pay for your medications: No.  Release of information consent forms completed and in the chart;  Patient's signature needed at discharge.  Patient to Follow up at: Follow-up Information    Services, [facility]. Go on 02/08/2018.   Why:  Screening for admission appointment is Wednesday, 02/08/18 at 7:45am. Please be sure to bring your Photo ID, SS card, proof of residency [facility]) and any discharge paperwork from this hospitalization.  Contact information: [address] [place] [address] [phone]        [facility] Follow up on 01/19/2018.   Specialty:  Behavioral Health Why:  Appointment for medication management and therapy services is Thursday, 01/19/18 at 8:00am. Please be sure to bring your Photo ID, medications and any discharge paperwork.  Contact information: [address] [place] [address] [phone]           Next level of care provider [facility]:yes  Safety Planning and Suicide Prevention discussed: Yes,  with the patient's father     Has patient been referred to the Quitline?: Patient refused referral  Patient has been referred for addiction treatment: Yes  Maeola SarahJolan E Tierra Divelbiss, LCSWA 01/17/2018, 10:34 AM

## 2018-01-17 NOTE — Progress Notes (Signed)
Patient ID: Adam PrayBrian K Gurevich, male   DOB: 09/15/1975, 42 y.o.   MRN: [id number] Patient discharged to home/self care.  Patient denies SI, HI and AVH upon discharge.  Patient stated that he was motivated to continue his recovery within the community.  Patient acknowledged understanding of all discharge instructions and receipt of personal belongings.

## 2018-01-17 NOTE — [facility] Suicide Risk Assessment (Signed)
[facility]   Principal Problem: MDD (major depressive disorder), severe [facility](HCC) Discharge Diagnoses:  Patient Active Problem List   Diagnosis Date Noted  . MDD (major depressive disorder), severe (HCC) [F32.2] 01/12/2018  . Alcohol dependence with acute alcoholic intoxication [facility](HCC) [F10.229] 05/04/2011    Total Time spent with patient: 15 minutes  Musculoskeletal: Strength & Muscle Tone: within normal limits Gait & Station: normal Patient leans: N/A  Psychiatric Specialty Exam: Review of Systems  All other systems reviewed and are negative.   Blood pressure (!) 142/106, pulse (!) 117, temperature 98.7 F (37.1 C), temperature source Oral, resp. rate 18, height 5\' 3"  (1.6 m), weight 50.3 kg, SpO2 100 %.Body mass index is 19.66 kg/m.  General Appearance: Casual  Eye Contact::  Good  Speech:  Normal Rate409  Volume:  Normal  Mood:  Euthymic  Affect:  Congruent  Thought Process:  Coherent  Orientation:  Full (Time, Place, and Person)  Thought Content:  Logical  Suicidal Thoughts:  No  Homicidal Thoughts:  No  Memory:  Immediate;   Fair Recent;   Fair Remote;   Fair  Judgement:  Intact  Insight:  Fair  Psychomotor Activity:  Normal  Concentration:  Good  Recall:  Good  Fund of Knowledge:Good  Language: Good  Akathisia:  Negative  Handed:  Right  AIMS (if indicated):     Assets:  Desire for Improvement Physical Health Resilience  Sleep:  Number of Hours: 6.75  Cognition: WNL  ADL's:  Intact   Mental Status Per Nursing Assessment::   On Admission:  NA  Demographic Factors:  Male, Caucasian, Low socioeconomic status and Unemployed  Loss Factors: NA  Historical Factors: Impulsivity  Risk Reduction Factors:   Positive coping skills or problem solving skills  Continued Clinical Symptoms:  Depression:   Impulsivity Alcohol/Substance Abuse/Dependencies  Cognitive Features That Contribute To Risk:  None    Suicide Risk:   Minimal: No identifiable suicidal ideation.  Patients presenting with no risk factors but with morbid ruminations; may be classified as minimal risk based on the severity of the depressive symptoms  Follow-up Information    Services, [facility]. Go on 02/08/2018.   Why:  Screening for admission appointment is Wednesday, 02/08/18 at 7:45am. Please be sure to bring your Photo ID, SS card, proof of residency [facility] and any discharge paperwork from this hospitalization.  Contact information: [address] [place] [address] [phone]        [facility] Follow up on 01/19/2018.   Specialty:  Behavioral Health Why:  Appointment for medication management and therapy services is Thursday, 01/19/18 at 8:00am. Please be sure to bring your Photo ID, medications and any discharge paperwork.  Contact information: [address] [place] [address] [phone]           Plan Of Care/Follow-up recommendations:  Activity:  ad lib  Antonieta PertGreg Lawson Clary, MD 01/17/2018, 9:47 AM

## 2018-01-17 NOTE — Progress Notes (Signed)
Patient ID: Adam Riley, male   DOB: 10-25-1975, 42 y.o.   MRN: [id number]  D: Patient pleasant on approach tonight. Reports possible discharge tomorrow and his anxiety is bad. He feels that he will be ready for discharge and currently denies any SI.  A: Staff will monitor on q 15 minute checks, follow treatment plan, and give medications as ordered. R: Cooperative on the unit.

## 2018-01-17 NOTE — BHH Suicide Risk Assessment (Signed)
BHH INPATIENT:  Family/Significant Other Suicide Prevention Education  Suicide Prevention Education:  Education Completed; Adam Riley, father [phone]) has been identified by the patient as the family member/significant other with whom the patient will be residing, and identified as the person(s) who will aid the patient in the event of a mental health crisis (suicidal ideations/suicide attempt).  With written consent from the patient, the family member/significant other has been provided the following suicide prevention education, prior to the and/or following the discharge of the patient.  The suicide prevention education provided includes the following:  Suicide risk factors  Suicide prevention and interventions  National Suicide Hotline telephone number  [facility] assessment telephone number  [facility] [place] Emergency Assistance 911  [facility] and/or Residential Mobile Crisis Unit telephone number  Request made of family/significant other to:  Remove weapons (e.g., guns, rifles, knives), all items previously/currently identified as safety concern.    Remove drugs/medications (over-the-counter, prescriptions, illicit drugs), all items previously/currently identified as a safety concern.  The family member/significant other verbalizes understanding of the suicide prevention education information provided.  The family member/significant other agrees to remove the items of safety concern listed above.  Adam Riley 01/17/2018, 10:17 AM

## 2018-01-17 NOTE — Plan of Care (Signed)
  Problem: Education: Goal: Knowledge of [place] [facility] Education information/materials will improve Outcome: Progressing   Problem: Education: Goal: Emotional status will improve Outcome: Progressing   Problem: Education: Goal: Mental status will improve Outcome: Progressing   Problem: Education: Goal: Verbalization of understanding the information provided will improve Outcome: Progressing   Problem: Activity: Goal: Interest or engagement in activities will improve Outcome: Progressing  Patient was anxious and pleasant upon approach this morning. Patient states he is excited for discharge, but nervous too. Patient denies withdrawal symptoms. Denies SI HI AVH. Patient is compliant with medications prescribed per provider. Safety is maintained with 15 minute checks as well as environmental checks. Will continue to monitor.

## 2018-01-19 NOTE — Congregational Nurse Program (Signed)
Client came to the [facility] clinic requesting Suboxone from our clinic.  Client was informed we do not supply Suboxone and provided resources.  Client states he is homeless [place] but needed a [place] bus ticket to get there.  [facility] and I would assist him with a PART bus ticket.  [facility] to [facility]

## 2018-03-02 ENCOUNTER — Other Ambulatory Visit: Payer: Self-pay

## 2018-03-02 ENCOUNTER — Emergency Department (HOSPITAL_COMMUNITY)
Admission: EM | Admit: 2018-03-02 | Discharge: 2018-03-02 | Disposition: A | Discharge status: Home / Self Care | Payer: Self-pay | Attending: Emergency Medicine | Admitting: Emergency Medicine

## 2018-03-02 ENCOUNTER — Encounter (HOSPITAL_COMMUNITY): Payer: Self-pay | Admitting: Emergency Medicine

## 2018-03-02 DIAGNOSIS — K047 Periapical abscess without sinus: Secondary | ICD-10-CM | POA: Insufficient documentation

## 2018-03-02 DIAGNOSIS — F17210 Nicotine dependence, cigarettes, uncomplicated: Secondary | ICD-10-CM | POA: Insufficient documentation

## 2018-03-02 MED ORDER — OXYCODONE-ACETAMINOPHEN 5-325 MG PO TABS
2.0000 | ORAL_TABLET | Freq: Once | ORAL | Status: AC
  Administered 2018-03-02: 2 via ORAL
  Filled 2018-03-02: qty 2

## 2018-03-02 MED ORDER — AMOXICILLIN 500 MG PO CAPS
500.0000 mg | ORAL_CAPSULE | Freq: Once | ORAL | Status: AC
Start: 2018-03-02 — End: 2018-03-02
  Administered 2018-03-02: 500 mg via ORAL
  Filled 2018-03-02: qty 1

## 2018-03-02 MED ORDER — HYDROCODONE-ACETAMINOPHEN 5-325 MG PO TABS: 1.0000 | ORAL_TABLET | ORAL | 0 refills | Status: AC | PRN

## 2018-03-02 MED ORDER — AMOXICILLIN-POT CLAVULANATE 875-125 MG PO TABS: 1.0000 | ORAL_TABLET | Freq: Once | ORAL | Status: DC

## 2018-03-02 MED ORDER — AMOXICILLIN 500 MG PO CAPS: 500.0000 mg | ORAL_CAPSULE | Freq: Three times a day (TID) | ORAL | 0 refills | Status: AC

## 2018-03-02 MED ORDER — ONDANSETRON HCL 4 MG PO TABS
4.0000 mg | ORAL_TABLET | Freq: Once | ORAL | Status: AC
Start: 2018-03-02 — End: 2018-03-02
  Administered 2018-03-02: 4 mg via ORAL
  Filled 2018-03-02: qty 1

## 2018-03-02 NOTE — Discharge Instructions (Addendum)

## 2018-03-02 NOTE — ED Triage Notes (Signed)
Patient woke up this morning with right upper/lower molar pain and right facial swelling, denies fever or chills , airway intact /respirations unlabored.

## 2018-03-02 NOTE — ED Notes (Signed)
Patient's BP is high, PA advised no orders.  He has not taken his BP medication and has a prescription waiting at the pharmacy.  Patient does not have the resources to get his prescriptions.  SW and Case Manager called.  SW at the bedside gave him a [employer] Medication Assistance card.

## 2018-03-02 NOTE — ED Provider Notes (Signed)
[facility] EMERGENCY DEPARTMENT Provider Note   CSN: [id number] Arrival date & time: 03/02/18  2013     History   Chief Complaint Chief Complaint  Patient presents with  . Dental Abscess    HPI Adam Riley is a 42 y.o. male presents the emergency department chief complaint of dental.  Patient has a history of recurrent dental abscess.  He has poor dentition.  He complains of onset of swelling above the right upper molar beginning this morning with progressive worsening.  He denies difficulty swallowing, fever or chills.  HPI  Past Medical History:  Diagnosis Date  . Anxiety   . Depression   . Heroin abuse (HCC)   . Methamphetamine addiction [facility])     Patient Active Problem List   Diagnosis Date Noted  . MDD (major depressive disorder), severe (HCC) 01/12/2018  . Alcohol dependence with acute alcoholic intoxication (HCC) 05/04/2011    History reviewed. No pertinent surgical history.      Home Medications    Prior to Admission medications   Medication Sig Start Date End Date Taking? Authorizing Provider  DULoxetine (CYMBALTA) 60 MG capsule Take 1 capsule (60 mg total) by mouth daily. 01/18/18   Laveda Abbe, NP  hydrOXYzine (ATARAX/VISTARIL) 25 MG tablet Take 1 tablet (25 mg total) by mouth every 6 (six) hours as needed for anxiety. 01/17/18   Laveda Abbe, NP  lisinopril (PRINIVIL,ZESTRIL) 5 MG tablet Take 1 tablet (5 mg total) by mouth daily. 01/18/18   Laveda Abbe, NP  QUEtiapine (SEROQUEL) 25 MG tablet Take 1 tablet (25 mg total) by mouth 2 (two) times daily as needed (agitation). 01/17/18   Laveda Abbe, NP    Family History No family history on file.  Social History Social History   Tobacco Use  . Smoking status: Current Every Day Smoker    Packs/day: 1.00    Years: 15.00    Pack years: 15.00    Types: Cigarettes  . Smokeless tobacco: Current User  Substance Use Topics  . Alcohol use: Not Currently     Alcohol/week: 126.0 standard drinks    Types: 126 Cans of beer per week    Frequency: Never  . Drug use: Yes    Types: IV, Methamphetamines    Comment: heroin, meth, and morphine use every day     Allergies   Patient has no known allergies.   Review of Systems Review of Systems Ten systems reviewed and are negative for acute change, except as noted in the HPI.    Physical Exam Updated Vital Signs BP (!) 163/108 (BP Location: Right Arm)   Pulse (!) 108   Temp 99.1 F (37.3 C) (Oral)   Resp 17   SpO2 98%   Physical Exam  Constitutional: He appears well-developed and well-nourished. No distress.  HENT:  Head: Normocephalic and atraumatic.  Mouth/Throat: Abnormal dentition.  Patient with significant facial swelling in the right cheek  He has significant dental decay throughout.  There is fluctuant swelling above the right upper premolar and first molar.  Eyes: Conjunctivae are normal. No scleral icterus.  Neck: Normal range of motion. Neck supple.  Cardiovascular: Normal rate, regular rhythm and normal heart sounds.  Pulmonary/Chest: Effort normal and breath sounds normal. No respiratory distress.  Abdominal: Soft. There is no tenderness.  Musculoskeletal: He exhibits no edema.  Neurological: He is alert.  Skin: Skin is warm and dry. He is not diaphoretic.  Psychiatric: His behavior is normal.  Nursing note and vitals reviewed.    ED Treatments / Results  Labs (all labs ordered are listed, but only abnormal results are displayed) Labs Reviewed - No data to display  EKG None  Radiology No results found.  Procedures Procedures (including critical care time)  Medications Ordered in ED Medications  oxyCODONE-acetaminophen (PERCOCET/ROXICET) 5-325 MG per tablet 2 tablet (has no administration in time range)  ondansetron (ZOFRAN) tablet 4 mg (has no administration in time range)  amoxicillin (AMOXIL) capsule 500 mg (has no administration in time range)      Initial Impression / Assessment and Plan / ED Course  I have reviewed the triage vital signs and the nursing notes.  Pertinent labs & imaging results that were available during my care of the patient were reviewed by me and considered in my medical decision making (see chart for details).     With dental abscess.  He declines I&D at this time.  Patient would like to try a round of antibiotics.  He has no evidence of Ludwig's angina.  He is hypertensive but has been unable to afford his medications.  Case management consulted with the patient and had him matched.  He is afebrile and otherwise hemodynamically stable and appears appropriate for discharge at this time gust return precautions.  Final Clinical Impressions(s) / ED Diagnoses   Final diagnoses:  None    ED Discharge Orders    None       Arthor Captain, PA-C 03/04/18 0143    Mancel Bale, MD 03/05/18 2130

## 2018-04-06 ENCOUNTER — Encounter (HOSPITAL_COMMUNITY): Payer: Self-pay

## 2018-04-06 ENCOUNTER — Emergency Department (HOSPITAL_COMMUNITY)
Admission: EM | Admit: 2018-04-06 | Discharge: 2018-04-06 | Disposition: A | Discharge status: Home / Self Care | Payer: Self-pay | Attending: Emergency Medicine | Admitting: Emergency Medicine

## 2018-04-06 ENCOUNTER — Other Ambulatory Visit: Payer: Self-pay

## 2018-04-06 ENCOUNTER — Encounter (HOSPITAL_COMMUNITY): Payer: Self-pay | Admitting: Emergency Medicine

## 2018-04-06 DIAGNOSIS — F17210 Nicotine dependence, cigarettes, uncomplicated: Secondary | ICD-10-CM | POA: Insufficient documentation

## 2018-04-06 DIAGNOSIS — F10229 Alcohol dependence with intoxication, unspecified: Secondary | ICD-10-CM | POA: Diagnosis present

## 2018-04-06 DIAGNOSIS — Z590 Homelessness: Secondary | ICD-10-CM | POA: Insufficient documentation

## 2018-04-06 DIAGNOSIS — Y906 Blood alcohol level of 120-199 mg/100 ml: Secondary | ICD-10-CM | POA: Insufficient documentation

## 2018-04-06 DIAGNOSIS — Z79899 Other long term (current) drug therapy: Secondary | ICD-10-CM | POA: Insufficient documentation

## 2018-04-06 DIAGNOSIS — Z008 Encounter for other general examination: Secondary | ICD-10-CM

## 2018-04-06 DIAGNOSIS — F332 Major depressive disorder, recurrent severe without psychotic features: Secondary | ICD-10-CM | POA: Insufficient documentation

## 2018-04-06 DIAGNOSIS — F1010 Alcohol abuse, uncomplicated: Secondary | ICD-10-CM | POA: Insufficient documentation

## 2018-04-06 DIAGNOSIS — R45851 Suicidal ideations: Secondary | ICD-10-CM | POA: Insufficient documentation

## 2018-04-06 DIAGNOSIS — F10220 Alcohol dependence with intoxication, uncomplicated: Secondary | ICD-10-CM | POA: Insufficient documentation

## 2018-04-06 LAB — CBC WITH DIFFERENTIAL/PLATELET
ABS IMMATURE GRANULOCYTES: 0.01 10*3/uL (ref 0.00–0.07)
BASOS PCT: 1 %
Basophils Absolute: 0 10*3/uL (ref 0.0–0.1)
EOS PCT: 2 %
Eosinophils Absolute: 0.1 10*3/uL (ref 0.0–0.5)
HCT: 44.4 % (ref 39.0–52.0)
HEMOGLOBIN: 15.1 g/dL (ref 13.0–17.0)
Immature Granulocytes: 0 %
Lymphocytes Relative: 43 %
Lymphs Abs: 2.3 10*3/uL (ref 0.7–4.0)
MCH: 31.5 pg (ref 26.0–34.0)
MCHC: 34 g/dL (ref 30.0–36.0)
MCV: 92.5 fL (ref 80.0–100.0)
MONO ABS: 0.3 10*3/uL (ref 0.1–1.0)
MONOS PCT: 6 %
NEUTROS ABS: 2.6 10*3/uL (ref 1.7–7.7)
Neutrophils Relative %: 48 %
Platelets: 283 10*3/uL (ref 150–400)
RBC: 4.8 MIL/uL (ref 4.22–5.81)
RDW: 13.2 % (ref 11.5–15.5)
WBC: 5.5 10*3/uL (ref 4.0–10.5)
nRBC: 0 % (ref 0.0–0.2)

## 2018-04-06 LAB — COMPREHENSIVE METABOLIC PANEL
ALK PHOS: 54 U/L (ref 38–126)
ALT: 23 U/L (ref 0–44)
AST: 23 U/L (ref 15–41)
Albumin: 3.9 g/dL (ref 3.5–5.0)
Anion gap: 12 (ref 5–15)
BILIRUBIN TOTAL: 0.3 mg/dL (ref 0.3–1.2)
BUN: 8 mg/dL (ref 6–20)
CALCIUM: 8.9 mg/dL (ref 8.9–10.3)
CO2: 25 mmol/L (ref 22–32)
CREATININE: 0.58 mg/dL — AB (ref 0.61–1.24)
Chloride: 105 mmol/L (ref 98–111)
GFR calc Af Amer: >60 mL/min (ref >60)
GFR calc non Af Amer: >60 mL/min (ref >60)
Glucose, Bld: 93 mg/dL (ref 70–99)
Potassium: 3.9 mmol/L (ref 3.5–5.1)
Sodium: 142 mmol/L (ref 135–145)
Total Protein: 7 g/dL (ref 6.5–8.1)

## 2018-04-06 LAB — ACETAMINOPHEN LEVEL

## 2018-04-06 LAB — ETHANOL: Alcohol, Ethyl (B): 158 mg/dL — ABNORMAL HIGH (ref <10)

## 2018-04-06 LAB — SALICYLATE LEVEL: Salicylate Lvl: <7 mg/dL (ref 2.8–30.0)

## 2018-04-06 MED ORDER — LORAZEPAM 2 MG/ML IJ SOLN: 0.0000 mg | Freq: Four times a day (QID) | INTRAMUSCULAR | Status: DC

## 2018-04-06 MED ORDER — HYDROXYZINE HCL 25 MG PO TABS
25.0000 mg | ORAL_TABLET | Freq: Four times a day (QID) | ORAL | Status: DC | PRN
  Administered 2018-04-06: 25 mg via ORAL
  Filled 2018-04-06: qty 1

## 2018-04-06 MED ORDER — LORAZEPAM 2 MG/ML IJ SOLN: 0.0000 mg | Freq: Two times a day (BID) | INTRAMUSCULAR | Status: DC

## 2018-04-06 MED ORDER — DULOXETINE HCL 30 MG PO CPEP
60.0000 mg | ORAL_CAPSULE | Freq: Every day | ORAL | Status: DC
  Administered 2018-04-06: 60 mg via ORAL
  Filled 2018-04-06: qty 2

## 2018-04-06 MED ORDER — QUETIAPINE FUMARATE 25 MG PO TABS
25.0000 mg | ORAL_TABLET | Freq: Two times a day (BID) | ORAL | Status: DC | PRN
  Administered 2018-04-06: 25 mg via ORAL
  Filled 2018-04-06: qty 1

## 2018-04-06 MED ORDER — VITAMIN B-1 100 MG PO TABS
100.0000 mg | ORAL_TABLET | Freq: Every day | ORAL | Status: DC
  Administered 2018-04-06: 100 mg via ORAL
  Filled 2018-04-06: qty 1

## 2018-04-06 MED ORDER — THIAMINE HCL 100 MG/ML IJ SOLN: 100.0000 mg | Freq: Every day | INTRAMUSCULAR | Status: DC

## 2018-04-06 MED ORDER — LORAZEPAM 1 MG PO TABS
0.0000 mg | ORAL_TABLET | Freq: Four times a day (QID) | ORAL | Status: DC
  Administered 2018-04-06: 1 mg via ORAL
  Filled 2018-04-06: qty 1

## 2018-04-06 MED ORDER — LORAZEPAM 1 MG PO TABS: 0.0000 mg | ORAL_TABLET | Freq: Two times a day (BID) | ORAL | Status: DC

## 2018-04-06 MED ORDER — ADULT MULTIVITAMIN W/MINERALS CH
1.0000 | ORAL_TABLET | Freq: Once | ORAL | Status: AC
  Administered 2018-04-06: 1 via ORAL
  Filled 2018-04-06: qty 1

## 2018-04-06 MED ORDER — LISINOPRIL 10 MG PO TABS
5.0000 mg | ORAL_TABLET | Freq: Every day | ORAL | Status: DC
  Administered 2018-04-06: 5 mg via ORAL
  Filled 2018-04-06: qty 1

## 2018-04-06 NOTE — ED Provider Notes (Signed)
[facility]-EMERGENCY DEPT Provider Note   CSN: [id number] Arrival date & time: 04/06/18  0228     History   Chief Complaint Chief Complaint  Patient presents with  . Hallucinations    HPI Adam Riley is a 42 y.o. male.  Patient presents to the emergency department with a chief complaint of alcohol abuse.  He states that he had too much to drink tonight.  He states that he initially claimed suicidal ideation in order to be seen so that he could get something to eat.  Patient he states that now that he has sobered up some, he denies any SI or HI.  He asks if he can go, but would like something to eat.  Denies any other complaints.  The history is provided by the patient. No language interpreter was used.    Past Medical History:  Diagnosis Date  . Anxiety   . Depression   . Heroin abuse (HCC)   . Methamphetamine addiction [facility]     Patient Active Problem List   Diagnosis Date Noted  . MDD (major depressive disorder), severe (HCC) 01/12/2018  . Alcohol dependence with acute alcoholic intoxication (HCC) 05/04/2011    History reviewed. No pertinent surgical history.      Home Medications    Prior to Admission medications   Medication Sig Start Date End Date Taking? Authorizing Provider  amoxicillin (AMOXIL) 500 MG capsule Take 1 capsule (500 mg total) by mouth 3 (three) times daily. 03/02/18   Arthor Captain, PA-C  DULoxetine (CYMBALTA) 60 MG capsule Take 1 capsule (60 mg total) by mouth daily. 01/18/18   Laveda Abbe, NP  HYDROcodone-acetaminophen (NORCO) 5-325 MG tablet Take 1-2 tablets by mouth every 4 (four) hours as needed. 03/02/18   Arthor Captain, PA-C  hydrOXYzine (ATARAX/VISTARIL) 25 MG tablet Take 1 tablet (25 mg total) by mouth every 6 (six) hours as needed for anxiety. 01/17/18   Laveda Abbe, NP  lisinopril (PRINIVIL,ZESTRIL) 5 MG tablet Take 1 tablet (5 mg total) by mouth daily. 01/18/18   Laveda Abbe, NP    QUEtiapine (SEROQUEL) 25 MG tablet Take 1 tablet (25 mg total) by mouth 2 (two) times daily as needed (agitation). 01/17/18   Laveda Abbe, NP    Family History History reviewed. No pertinent family history.  Social History Social History   Tobacco Use  . Smoking status: Current Every Day Smoker    Packs/day: 1.00    Years: 15.00    Pack years: 15.00    Types: Cigarettes  . Smokeless tobacco: Current User  Substance Use Topics  . Alcohol use: Not Currently    Alcohol/week: 126.0 standard drinks    Types: 126 Cans of beer per week    Frequency: Never  . Drug use: Yes    Types: IV, Methamphetamines    Comment: heroin, meth, and morphine use every day     Allergies   Patient has no known allergies.   Review of Systems Review of Systems  All other systems reviewed and are negative.    Physical Exam Updated Vital Signs BP (!) 156/116 (BP Location: Left Arm)   Pulse (!) 102   Temp 98.2 F (36.8 C) (Oral)   SpO2 97%   Physical Exam  Constitutional: He is oriented to person, place, and time. He appears well-developed and well-nourished.  HENT:  Head: Normocephalic and atraumatic.  Eyes: Pupils are equal, round, and reactive to light. Conjunctivae and EOM are normal. Right eye  exhibits no discharge. Left eye exhibits no discharge. No scleral icterus.  Neck: Normal range of motion. Neck supple. No JVD present.  Cardiovascular: Normal rate, regular rhythm and normal heart sounds. Exam reveals no gallop and no friction rub.  No murmur heard. Pulmonary/Chest: Effort normal and breath sounds normal. No respiratory distress. He has no wheezes. He has no rales. He exhibits no tenderness.  Abdominal: Soft. He exhibits no distension and no mass. There is no tenderness. There is no rebound and no guarding.  Musculoskeletal: Normal range of motion. He exhibits no edema or tenderness.  Neurological: He is alert and oriented to person, place, and time.  Skin: Skin is warm  and dry.  Psychiatric: He has a normal mood and affect. His behavior is normal. Judgment and thought content normal.  Nursing note and vitals reviewed.    ED Treatments / Results  Labs (all labs ordered are listed, but only abnormal results are displayed) Labs Reviewed  COMPREHENSIVE METABOLIC PANEL  ETHANOL  SALICYLATE LEVEL  ACETAMINOPHEN LEVEL  CBC  RAPID URINE DRUG SCREEN, HOSP PERFORMED    EKG None  Radiology No results found.  Procedures Procedures (including critical care time)  Medications Ordered in ED Medications - No data to display   Initial Impression / Assessment and Plan / ED Course  I have reviewed the triage vital signs and the nursing notes.  Pertinent labs & imaging results that were available during my care of the patient were reviewed by me and considered in my medical decision making (see chart for details).     Denies SI or HI to me.  In no acute distress.  Final Clinical Impressions(s) / ED Diagnoses   Final diagnoses:  Alcohol abuse    ED Discharge Orders    None       Roxy Horseman, PA-C 04/06/18 0310    Molpus, Jonny Ruiz, MD 04/06/18 4098

## 2018-04-06 NOTE — ED Provider Notes (Signed)
[facility]-EMERG[facility] DEPT Provider Note   CSN: [id number] Arrival date & time: 04/06/18  0539     History   Chief Complaint Chief Complaint  Patient presents with  . Suicidal    HPI Adam Riley is a 42 y.o. male with a history of alcohol abuse, heroin abuse, anxiety depression who presents the emergent department today for suicidal ideation.  Patient was seen here earlier today for alcohol abuse and denied SI or HI.  He was asking for something to eat denies any other complaints.  He was discharged home in good condition.  He presents back here today stating that he now has suicidal ideation.  He was on a bus station sunglasses and wanted to slit his throat.  He denies attempts to harm himself.  He denies any homicidal ideation.  No visual or auditory hallucinations reported to myself, however he has mentioned this to triage nurse.  He notes that he has had increasing sadness since losing his wife several months ago.  He reports that he normally drinks 3, 40 ounce alcoholic beverages per day with the last earlier this morning.  He denies history of withdrawals.  No complaints at this time.  HPI  Past Medical History:  Diagnosis Date  . Anxiety   . Depression   . Heroin abuse (HCC)   . Methamphetamine addiction [facility])     Patient Active Problem List   Diagnosis Date Noted  . MDD (major depressive disorder), severe (HCC) 01/12/2018  . Alcohol dependence with acute alcoholic intoxication (HCC) 05/04/2011    History reviewed. No pertinent surgical history.      Home Medications    Prior to Admission medications   Medication Sig Start Date End Date Taking? Authorizing Provider  amoxicillin (AMOXIL) 500 MG capsule Take 1 capsule (500 mg total) by mouth 3 (three) times daily. 03/02/18   Arthor Captain, PA-C  DULoxetine (CYMBALTA) 60 MG capsule Take 1 capsule (60 mg total) by mouth daily. 01/18/18   Laveda Abbe, NP  HYDROcodone-acetaminophen (NORCO)  5-325 MG tablet Take 1-2 tablets by mouth every 4 (four) hours as needed. 03/02/18   Arthor Captain, PA-C  hydrOXYzine (ATARAX/VISTARIL) 25 MG tablet Take 1 tablet (25 mg total) by mouth every 6 (six) hours as needed for anxiety. 01/17/18   Laveda Abbe, NP  lisinopril (PRINIVIL,ZESTRIL) 5 MG tablet Take 1 tablet (5 mg total) by mouth daily. 01/18/18   Laveda Abbe, NP  QUEtiapine (SEROQUEL) 25 MG tablet Take 1 tablet (25 mg total) by mouth 2 (two) times daily as needed (agitation). 01/17/18   Laveda Abbe, NP    Family History History reviewed. No pertinent family history.  Social History Social History   Tobacco Use  . Smoking status: Current Every Day Smoker    Packs/day: 1.00    Years: 15.00    Pack years: 15.00    Types: Cigarettes  . Smokeless tobacco: Current User  Substance Use Topics  . Alcohol use: Not Currently    Alcohol/week: 126.0 standard drinks    Types: 126 Cans of beer per week    Frequency: Never  . Drug use: Yes    Types: IV, Methamphetamines    Comment: heroin, meth, and morphine use every day     Allergies   Patient has no known allergies.   Review of Systems Review of Systems  All other systems reviewed and are negative.    Physical Exam Updated Vital Signs BP (!) 123/96  Pulse (!) 101   Temp 98 F (36.7 C) (Oral)   Resp 16   Ht 5\' 2"  (1.575 m)   Wt 50.3 kg   SpO2 97%   BMI 20.28 kg/m   Physical Exam  Constitutional: He appears well-developed and well-nourished.  HENT:  Head: Normocephalic and atraumatic.  Right Ear: External ear normal.  Left Ear: External ear normal.  Nose: Nose normal.  Mouth/Throat: Uvula is midline, oropharynx is clear and moist and mucous membranes are normal. No tonsillar exudate.  Eyes: Pupils are equal, round, and reactive to light. Right eye exhibits no discharge. Left eye exhibits no discharge. No scleral icterus.  Neck: Trachea normal. Neck supple. No spinous process tenderness  present. No neck rigidity. Normal range of motion present.  Cardiovascular: Normal rate, regular rhythm and intact distal pulses.  No murmur heard. Pulses:      Radial pulses are 2+ on the right side, and 2+ on the left side.       Dorsalis pedis pulses are 2+ on the right side, and 2+ on the left side.       Posterior tibial pulses are 2+ on the right side, and 2+ on the left side.  No lower extremity swelling or edema. Calves symmetric in size bilaterally.  Pulmonary/Chest: Effort normal and breath sounds normal. He exhibits no tenderness.  Abdominal: Soft. Bowel sounds are normal. There is no tenderness. There is no rigidity, no rebound and no guarding.  Musculoskeletal: He exhibits no edema.  Lymphadenopathy:    He has no cervical adenopathy.  Neurological: He is alert.  Skin: Skin is warm and dry. No rash noted. He is not diaphoretic.  Psychiatric: He has a normal mood and affect.  Nursing note and vitals reviewed.    ED Treatments / Results  Labs (all labs ordered are listed, but only abnormal results are displayed) Labs Reviewed  ACETAMINOPHEN LEVEL - Abnormal; Notable for the following components:      Result Value   Acetaminophen (Tylenol), Serum <10 (*)    All other components within normal limits  COMPREHENSIVE METABOLIC PANEL - Abnormal; Notable for the following components:   Creatinine, Ser 0.58 (*)    All other components within normal limits  ETHANOL - Abnormal; Notable for the following components:   Alcohol, Ethyl (B) 158 (*)    All other components within normal limits  SALICYLATE LEVEL  CBC WITH DIFFERENTIAL/PLATELET  RAPID URINE DRUG SCREEN, HOSP PERFORMED    EKG None  Radiology No results found.  Procedures Procedures (including critical care time)  Medications Ordered in ED Medications  LORazepam (ATIVAN) injection 0-4 mg (has no administration in time range)    Or  LORazepam (ATIVAN) tablet 0-4 mg (has no administration in time range)    LORazepam (ATIVAN) injection 0-4 mg (has no administration in time range)    Or  LORazepam (ATIVAN) tablet 0-4 mg (has no administration in time range)  thiamine (VITAMIN B-1) tablet 100 mg (has no administration in time range)    Or  thiamine (B-1) injection 100 mg (has no administration in time range)     Initial Impression / Assessment and Plan / ED Course  I have reviewed the triage vital signs and the nursing notes.  Pertinent labs & imaging results that were available during my care of the patient were reviewed by me and considered in my medical decision making (see chart for details).     42 y.o. male with hx of alcohol abuse presenting with  SI. Patient reports he has a plan. Previously reported hallucinations. Denies this to myself. No HI. He denies physical complaints. Does report alcohol abuse. CIWA protocol initiated. He does not appear to be in withdrawal at this time. Home meds reordered. Labs reviewed. Patient medically cleared. TTS consulted.   Final Clinical Impressions(s) / ED Diagnoses   Final diagnoses:  Encounter for medical clearance for patient hold    ED Discharge Orders    None       Princella Pellegrini 04/06/18 1610    Paula Libra, MD 04/06/18 2240

## 2018-04-06 NOTE — ED Triage Notes (Signed)
Pt reports auditory hallucinations. Specifically, hearing, "the bombs of 9/11." Pt reports a hx of PTSD from the [employer]. He is endorses SI without a specific plan and reports that he is hungry. A&Ox4. Ambulatory.

## 2018-04-06 NOTE — ED Notes (Signed)
Pt stating he wants to go home. Pt denying SI. Pt stating he is ready to leave and does not want to be evaluated further. Pt refuses discharge vital signs

## 2018-04-06 NOTE — ED Triage Notes (Signed)
Patient complains of hallucinating. Patient states he saw some glass and wants to kill himself while [place] the bus [place].

## 2018-04-06 NOTE — ED Notes (Signed)
Bed: [facility] Expected date:  Expected time:  Means of arrival:  Comments: 

## 2018-04-06 NOTE — Discharge Instructions (Signed)
For your mental health needs, you are advised to follow up with [facility].  New and returning patients are seen at their walk-in clinic.  Walk-in hours are Monday - Friday from 8:00 am - 3:00 pm.  Walk-in patients are seen on a first come, first served basis.  Try to arrive as early as possible for he best chance of being seen the same day: ° °     [facility] °     [address] °     [place], [place] [address] °     [phone] °

## 2018-04-06 NOTE — BH Assessment (Signed)
[facility] Assessment Progress Note     Per Roosvelt Harps, DO and Elta Guadeloupe, FNP, patient can be discharged from ED, he does not meet admission criteria, but will need a social work consult prior to leaving the ED.

## 2018-04-06 NOTE — BH Assessment (Signed)
[facility] Assessment Progress Note  Per Juanetta Beets, DO, this pt does not require psychiatric hospitalization at this time.  Pt is to be discharged from [facility] with recommenation to follow up with [facility].  This has been included in pt's discharge instructions.  Pt's nurse has been notified.  Doylene Canning, MA Triage Specialist [phone]

## 2018-04-06 NOTE — BHH Suicide Risk Assessment (Cosign Needed)
Suicide Risk Assessment  Discharge Assessment   [facility] Discharge Suicide Risk Assessment   Principal Problem: Alcohol dependence with acute alcoholic intoxication [facility]) Discharge Diagnoses:  Patient Active Problem List   Diagnosis Date Noted  . MDD (major depressive disorder), severe (HCC) [F32.2] 01/12/2018  . Alcohol dependence with acute alcoholic intoxication [facility]) [F10.229] 05/04/2011   Pt was seen and chart reviewed with treatment team and Dr Sharma Covert. Pt denies suicidal/homicidal ideation, denies auditory/visual hallucinations and does not appear to be responding to internal stimuli. Pt stated he had been drinking yesterday, BAL 158 and UDS negative, and had some thoughts of wanting to harm himself. Today he stated he feels better and is ready to be discharged. Pt stated he is homeless and drinks about a 12 pack of beer two times a week. Pt does not work and does not receive disability and is homeless. He stated he stays with friends. He was offered to speak with Peer Support for his alcohol abuse and he declined. Pt will be given outpatient resources and is psychiatrically clear for discharge.   Total Time spent with patient: 30 minutes  Musculoskeletal: Strength & Muscle Tone: within normal limits Gait & Station: normal Patient leans: N/A  Psychiatric Specialty Exam:   Blood pressure (!) 123/96, pulse (!) 101, temperature 98 F (36.7 C), temperature source Oral, resp. rate 16, height 5\' 2"  (1.575 m), weight 50.3 kg, SpO2 97 %.Body mass index is 20.28 kg/m.  General Appearance: Casual  Eye Contact::  Good  Speech:  Clear and Coherent and Normal Rate409  Volume:  Normal  Mood:  Irritable  Affect:  Congruent  Thought Process:  Coherent, Goal Directed and Linear  Orientation:  Full (Time, Place, and Person)  Thought Content:  Logical  Suicidal Thoughts:  No  Homicidal Thoughts:  No  Memory:  Immediate;   Good Recent;   Good Remote;   Fair  Judgement:  Fair  Insight:  Fair   Psychomotor Activity:  Normal  Concentration:  Good  Recall:  Good  Fund of Knowledge:Good  Language: Good  Akathisia:  No  Handed:  Right  AIMS (if indicated):     Assets:  Communication Skills Physical Health Resilience  Sleep:     Cognition: WNL  ADL's:  Intact   Mental Status Per Nursing Assessment::   On Admission:     Demographic Factors:  Male, Caucasian, Low socioeconomic status and Unemployed  Loss Factors: Financial problems/change in socioeconomic status  Historical Factors: Impulsivity  Risk Reduction Factors:   Sense of responsibility to family  Continued Clinical Symptoms:  Alcohol/Substance Abuse/Dependencies  Cognitive Features That Contribute To Risk:  Closed-mindedness    Suicide Risk:  Minimal: No identifiable suicidal ideation.  Patients presenting with no risk factors but with morbid ruminations; may be classified as minimal risk based on the severity of the depressive symptoms    Plan Of Care/Follow-up recommendations:  Activity:  as tolerated Diet:  Heart healthy  Laveda Abbe, NP 04/06/2018, 11:25 AM

## 2018-04-06 NOTE — ED Notes (Signed)
Pt states that he is leaving in 20 mins if he doesn't see a doctor and get to eat.

## 2018-04-06 NOTE — BH Assessment (Signed)
Assessment Note Patient states: "I need to go to [facility] and get back on my medication."  Adam Riley is an 42 y.o. male who presented to [facility] with suicidal ideation with thoughts about cutting his wrists with a piece of glass.  Patient states that he has multiple prior suicide attempts and he has been hospitalized at least 14 times in the past for his depression and that his last hospitalization was at [facility] in August 2019.  Patient states that he did not follow-up with any outpatient services and states that he has been off his medications for the past two months. Patient states that he has been experiencing racing thoughts and that he is unable to concentrate. Patient states that he gets frustrated because of his inability to focus and he states that not bing able to focus is keeping him from getting a job.  Patient denies any HI/Psychosis.  Patient states that he has been drinking 3 forty ounce beers every other day and that he drank yesterday.  Patient denies any history of abuse or self-mutilation.  He states that he is expereincing some sleep disturbance only sleeping five hours per night.  He states that his appetite has been good and that he recently gained ten pounds.  Patient states that he is current ly homeless.  He states that he is not currently working.  Patient states that he is single, but states that he has three children ages 72,16 and 53.  Patient presented as oriented and alert.  He was a bit disheveled due to being homeless.  His thoughts were organized and his memory intact.  Patient presented as depressed.  His motor activity was unremarkable.  His insight, judgment and impulse control have been poor.  Patient had good eye contact and his speech was clear and coherent during the assessment process.  Patient did not appear to be responding to internal stimuli.  He was cooperative during the assessment process.  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 Anxiety   . Depression   . Heroin abuse (HCC)   . Methamphetamine addiction (HCC)     History reviewed. No pertinent surgical history.  Family History: History reviewed. No pertinent family history.  Social History:  reports that he has been smoking cigarettes. He has a 15.00 pack-year smoking history. He uses smokeless tobacco. He reports that he drinks about 126.0 standard drinks of alcohol per week. He reports that he has current or past drug history. Drugs: IV and Methamphetamines.  Additional Social History:  Alcohol / Drug Use Pain Medications: see MAR Prescriptions: see MAR Over the Counter: see MAR History of alcohol / drug use?: Yes Longest period of sobriety (when/how long): Patient did not identify any substantial sober time Negative Consequences of Use: Financial, Legal, Personal relationships, Work / School Substance #1 Name of Substance 1: alcohol 1 - Age of First Use: 21 1 - Amount (size/oz): 3 forties 1 - Frequency: every other day 1 - Duration: since onset 1 - Last Use / Amount: last use was last pm  CIWA: CIWA-Ar BP: (!) 123/96 Pulse Rate: (!) 101 Nausea and Vomiting: no nausea and no vomiting Tactile Disturbances: none Tremor: not visible, but can be felt fingertip to fingertip Auditory Disturbances: very mild harshness or ability to frighten Paroxysmal Sweats: barely perceptible sweating, palms moist Visual Disturbances: not present Anxiety: mildly anxious Headache, Fullness in Head: none present Agitation: normal  activity Orientation and Clouding of Sensorium: oriented and can do serial additions CIWA-Ar Total: 4 COWS:    Allergies: No Known Allergies  Home Medications:  (Not in a hospital admission)  OB/GYN Status:  No LMP for male patient.  General Assessment Data [facility] of [facility]: [facility] TTS Assessment: In system Is this a Tele or Face-to-Face Assessment?:  Face-to-Face Is this an Initial Assessment or a Re-assessment for this encounter?: Initial Assessment Patient Accompanied by:: N/A Language Other than English: No Living Arrangements: Homeless/Shelter What gender do you identify as?: Male Marital status: Single Living Arrangements: Alone Can pt return to current living arrangement?: Yes Admission Status: Voluntary Is patient capable of signing voluntary admission?: Yes Referral Source: Self/Family/Friend Insurance type: (self-pay)     Crisis Care Plan Living Arrangements: Alone Legal Guardian: Other:(self) Name of Psychiatrist: none Name of Therapist: none  Education Status Is patient currently in school?: No Is the patient employed, unemployed or receiving disability?: Unemployed  Risk to self with the past 6 months Suicidal Ideation: Yes-Currently Present Has patient been a risk to self within the past 6 months prior to admission? : No Suicidal Intent: No(patient sought help instead of acting on thoughts) Has patient had any suicidal intent within the past 6 months prior to admission? : No Is patient at risk for suicide?: Yes Suicidal Plan?: Yes-Currently Present Has patient had any suicidal plan within the past 6 months prior to admission? : No Specify Current Suicidal Plan: cut wrists Access to Means: Yes Specify Access to Suicidal Means: glass What has been your use of drugs/alcohol within the last 12 months?: (drinking every other day) Previous Attempts/Gestures: Yes How many times?: (multiple (14)) Other Self Harm Risks: (homeless and minimal support) Triggers for Past Attempts: Unknown Intentional Self Injurious Behavior: None Family Suicide History: No Recent stressful life event(s): Job Loss, Other (Comment)(homeless) Persecutory voices/beliefs?: No Depression: Yes Depression Symptoms: Despondent, Insomnia, Isolating, Loss of interest in usual pleasures, Feeling worthless/self pity Substance abuse history  and/or treatment for substance abuse?: Yes Suicide prevention information given to non-admitted patients: Not applicable  Risk to Others within the past 6 months Homicidal Ideation: No Does patient have any lifetime risk of violence toward others beyond the six months prior to admission? : No Thoughts of Harm to Others: No Current Homicidal Intent: No Current Homicidal Plan: No Access to Homicidal Means: No Identified Victim: none History of harm to others?: No Assessment of Violence: None Noted Violent Behavior Description: none Does patient have access to weapons?: No Criminal Charges Pending?: No Does patient have a court date: No Is patient on probation?: No  Psychosis Hallucinations: None noted Delusions: None noted  Mental Status Report Appearance/Hygiene: Disheveled Eye Contact: Good Motor Activity: Unremarkable Speech: Logical/coherent Level of Consciousness: Alert Mood: Depressed Affect: Appropriate to circumstance, Depressed Anxiety Level: Minimal Thought Processes: Flight of Ideas(racing thoughts, unable to concentrate) Judgement: Impaired Orientation: Person, Place, Time, Situation Obsessive Compulsive Thoughts/Behaviors: None  Cognitive Functioning Concentration: Decreased Memory: Recent Intact, Remote Intact Is patient IDD: No Insight: Poor Impulse Control: Poor Appetite: Good Have you had any weight changes? : Gain Amount of the weight change? (lbs): 10 lbs Sleep: Decreased Total Hours of Sleep: 5 Vegetative Symptoms: None  ADLScreening [facility] Assessment Services) Patient's cognitive ability adequate to safely complete daily activities?: Yes Patient able to express need for assistance with ADLs?: Yes Independently performs ADLs?: Yes (appropriate for developmental age)  Prior Inpatient Therapy Prior Inpatient Therapy: Yes Prior Therapy Dates: (August 2019) Prior Therapy Facilty/Provider(s): [facility])  Reason for Treatment: depression  Prior  Outpatient Therapy Prior Outpatient Therapy: No Does patient have an ACCT team?: No Does patient have Intensive In-House Services?  : No Does patient have [facility] services? : No Does patient have P4[facility] services?: No  ADL Screening (condition at time of admission) Patient's cognitive ability adequate to safely complete daily activities?: Yes Is the patient deaf or have difficulty hearing?: No Does the patient have difficulty seeing, even when wearing glasses/contacts?: No Does the patient have difficulty concentrating, remembering, or making decisions?: No Patient able to express need for assistance with ADLs?: Yes Does the patient have difficulty dressing or bathing?: No Independently performs ADLs?: Yes (appropriate for developmental age) Does the patient have difficulty walking or climbing stairs?: No Weakness of Legs: None Weakness of Arms/Hands: None  Home Assistive Devices/Equipment Home Assistive Devices/Equipment: None  Therapy Consults (therapy consults require a physician order) PT Evaluation Needed: No OT Evalulation Needed: No SLP Evaluation Needed: No Abuse/Neglect Assessment (Assessment to be complete while patient is alone) Abuse/Neglect Assessment Can Be Completed: Yes Physical Abuse: Denies Verbal Abuse: Denies Sexual Abuse: Denies Exploitation of patient/patient's resources: Denies Self-Neglect: Denies Values / Beliefs Cultural Requests During Hospitalization: None Spiritual Requests During Hospitalization: None Consults Spiritual Care Consult Needed: No Social Work Consult Needed: No Merchant navy officer (For Healthcare) Does Patient Have a Medical Advance Directive?: No Would patient like information on creating a medical advance directive?: No - Patient declined Nutrition Screen- [facility] Adult/[facility] Has the patient recently lost weight without trying?: No Has the patient been eating poorly because of a decreased appetite?: No Malnutrition Screening Tool Score:  0        Disposition: Per Roosvelt Harps, DO and Elta Guadeloupe, FNP, patient can be discharged from ED, he does not meet admission criteria, but will need a social work consult prior to leaving the ED.   Disposition Initial Assessment Completed for this Encounter: Yes Disposition of Patient: (pending provider Review)  On Site Evaluation by:   Reviewed with Physician:    Arnoldo Lenis Aldora Perman 04/06/2018 8:25 AM
